# Patient Record
Sex: Male | Born: 1960 | Race: Black or African American | Hispanic: No | Smoking: Current every day smoker
Health system: Southern US, Community
[De-identification: ages and names within clinical notes are randomized; demographics above are authoritative.]

## PROBLEM LIST (undated history)

## (undated) ENCOUNTER — Emergency Department (HOSPITAL_BASED_OUTPATIENT_CLINIC_OR_DEPARTMENT_OTHER): Admission: EM | Payer: Medicare Other | Source: Home / Self Care

## (undated) DIAGNOSIS — G8929 Other chronic pain: Secondary | ICD-10-CM

## (undated) DIAGNOSIS — J449 Chronic obstructive pulmonary disease, unspecified: Secondary | ICD-10-CM

## (undated) DIAGNOSIS — IMO0002 Reserved for concepts with insufficient information to code with codable children: Secondary | ICD-10-CM

## (undated) HISTORY — PX: APPENDECTOMY: SHX54

## (undated) HISTORY — PX: KNEE SURGERY: SHX244

## (undated) HISTORY — PX: NECK SURGERY: SHX720

---

## 2011-11-19 ENCOUNTER — Emergency Department (HOSPITAL_BASED_OUTPATIENT_CLINIC_OR_DEPARTMENT_OTHER): Payer: Medicare Other

## 2011-11-19 ENCOUNTER — Emergency Department (HOSPITAL_BASED_OUTPATIENT_CLINIC_OR_DEPARTMENT_OTHER)
Admission: EM | Admit: 2011-11-19 | Discharge: 2011-11-19 | Disposition: A | Discharge status: Home / Self Care | Payer: Medicare Other | Attending: Emergency Medicine | Admitting: Emergency Medicine

## 2011-11-19 ENCOUNTER — Encounter (HOSPITAL_BASED_OUTPATIENT_CLINIC_OR_DEPARTMENT_OTHER): Payer: Self-pay | Admitting: *Deleted

## 2011-11-19 DIAGNOSIS — J4489 Other specified chronic obstructive pulmonary disease: Secondary | ICD-10-CM | POA: Insufficient documentation

## 2011-11-19 DIAGNOSIS — R918 Other nonspecific abnormal finding of lung field: Secondary | ICD-10-CM | POA: Insufficient documentation

## 2011-11-19 DIAGNOSIS — J449 Chronic obstructive pulmonary disease, unspecified: Secondary | ICD-10-CM | POA: Insufficient documentation

## 2011-11-19 DIAGNOSIS — K5900 Constipation, unspecified: Secondary | ICD-10-CM | POA: Insufficient documentation

## 2011-11-19 DIAGNOSIS — F17200 Nicotine dependence, unspecified, uncomplicated: Secondary | ICD-10-CM | POA: Insufficient documentation

## 2011-11-19 DIAGNOSIS — R109 Unspecified abdominal pain: Secondary | ICD-10-CM | POA: Insufficient documentation

## 2011-11-19 HISTORY — DX: Chronic obstructive pulmonary disease, unspecified: J44.9

## 2011-11-19 HISTORY — DX: Other chronic pain: G89.29

## 2011-11-19 LAB — BASIC METABOLIC PANEL
BUN: 10 mg/dL (ref 6–23)
Chloride: 102 mEq/L (ref 96–112)
GFR calc Af Amer: 79 mL/min — ABNORMAL LOW (ref >90)
Glucose, Bld: 107 mg/dL — ABNORMAL HIGH (ref 70–99)
Potassium: 3.8 mEq/L (ref 3.5–5.1)

## 2011-11-19 MED ORDER — ALBUTEROL SULFATE (5 MG/ML) 0.5% IN NEBU
2.5000 mg | INHALATION_SOLUTION | RESPIRATORY_TRACT | Status: DC
  Administered 2011-11-19: 2.5 mg via RESPIRATORY_TRACT
  Filled 2011-11-19: qty 0.5

## 2011-11-19 MED ORDER — IOHEXOL 300 MG/ML  SOLN
80.0000 mL | Freq: Once | INTRAMUSCULAR | Status: AC | PRN
  Administered 2011-11-19: 80 mL via INTRAVENOUS

## 2011-11-19 MED ORDER — IPRATROPIUM BROMIDE 0.02 % IN SOLN
0.5000 mg | RESPIRATORY_TRACT | Status: DC
  Administered 2011-11-19: 0.5 mg via RESPIRATORY_TRACT
  Filled 2011-11-19: qty 2.5

## 2011-11-19 NOTE — ED Notes (Signed)
Abdominal pressure since yesterday. Feels constipated. Took Murelax and stool softeners. States he takes a lot of narcotics for chronic pain.

## 2011-11-20 NOTE — ED Provider Notes (Signed)
History     CSN: [id number]  Arrival date & time 11/19/11  1908   First MD Initiated Contact with Patient 11/19/11 1918      Chief Complaint  Patient presents with  . Abdominal Pain    (Consider location/radiation/quality/duration/timing/severity/associated sxs/prior treatment) HPI Patient is a 51 yo male with history of COPD and frequent pain medication ingestion for spinal issues who presents complaining of diffuse abdominal discomfort and feeling of fullness.  Pain is described as moderate and achy.  He denies fevers, nausea, vomiting, sick contacts, diarrhea, or blood in his stools.  Patient reports constipation but last BM was today and normal in size.  These are occuring at least every other day.  PAtient says he has not been taking his miralax like he is supposed to.  He has no history of abdominal surgery. There are no other associated or modifying factors.  Past Medical History  Diagnosis Date  . Chronic pain   . COPD (chronic obstructive pulmonary disease)     Past Surgical History  Procedure Date  . Neck surgery   . Appendectomy     History reviewed. No pertinent family history.  History  Substance Use Topics  . Smoking status: Current Everyday Smoker -- 0.5 packs/day  . Smokeless tobacco: Not on file  . Alcohol Use: No      Review of Systems  Constitutional: Negative.   HENT: Negative.   Eyes: Negative.   Respiratory: Negative.   Cardiovascular: Negative.   Gastrointestinal: Positive for abdominal pain and constipation.  Genitourinary: Negative.   Musculoskeletal: Negative.   Skin: Negative.   Neurological: Negative.   Hematological: Negative.   Psychiatric/Behavioral: Negative.   All other systems reviewed and are negative.    Allergies  Review of patient's allergies indicates not on file.  Home Medications  No current outpatient prescriptions on file.  BP 140/70  Pulse 85  Temp 98.9 F (37.2 C) (Oral)  Resp 14  SpO2 96%  Physical  Exam  Nursing note and vitals reviewed. GEN: Well-developed, well-nourished male in no distress HEENT: Atraumatic, normocephalic. Oropharynx clear without erythema EYES: PERRLA BL, no scleral icterus. NECK: Trachea midline, no meningismus CV: regular rate and rhythm. No murmurs, rubs, or gallops PULM: No respiratory distress.  No crackles, wheezes, or rales.diminished throughout. More diminished on R compared to left. GI: soft, diffuse TTP. No guarding, rebound. + bowel sounds  GU: deferred Neuro: cranial nerves grossly 2-12 intact, no abnormalities of strength or sensation, A and O x 3 MSK: Patient moves all 4 extremities symmetrically, no deformity, edema, or injury noted Skin: No rashes petechiae, purpura, or jaundice Psych: anxious   ED Course  Procedures (including critical care time)  Labs Reviewed  BASIC METABOLIC PANEL - Abnormal; Notable for the following:    Glucose, Bld 107 (*)     GFR calc non Af Amer 68 (*)     GFR calc Af Amer 79 (*)     All other components within normal limits   Ct Angio Chest Pe W/cm &/or Wo Cm  11/19/2011  *RADIOLOGY REPORT*  Clinical Data: COPD, pain and right lung opacity by chest x-ray on abdominal series.  CT ANGIOGRAPHY CHEST  Technique:  Multidetector CT imaging of the chest using the standard protocol during bolus administration of intravenous contrast. Multiplanar reconstructed images including MIPs were obtained and reviewed to evaluate the vascular anatomy.  Contrast: 80mL OMNIPAQUE IOHEXOL 300 MG/ML SOLN  Comparison: Chest x-ray earlier today as part of an abdominal series.  Findings: Abnormality seen by chest x-ray represents an extended transversely oriented band like area of scarring and compressed lung in the right upper lobe that posteriorly abuts the major fissure.  The posterior aspect of this opacity is somewhat thicker and more focal in appearance measuring 11 mm.  Finding is felt to likely be benign as there is surrounding compressed  lung present due to a very large right upper bulla that occupies the entire apical portion of the right hemithorax and measures up to 13 cm in diameter.  No other abnormal parenchymal lesions are identified.  There is no evidence of pneumothorax.  No enlarged lymph nodes are seen.  The heart size is normal.  No evidence of pulmonary embolism.  The thoracic aorta is of normal caliber.  Some small probable benign cysts are identified in the left lobe of the liver.  No pleural effusions.  No bony abnormalities are identified.  IMPRESSION: Chest x-ray abnormality represents an area of compressed lung and scarring of the right upper lobe which is transversely oriented.  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   Original Report Authenticated By: Reola Calkins, M.D.    Dg Abd Acute W/chest  11/19/2011  *RADIOLOGY REPORT*  Clinical Data: Abdominal pain.  ACUTE ABDOMEN SERIES (ABDOMEN 2 VIEW & CHEST 1 VIEW)  Comparison: None.  Findings: Bowel gas pattern unremarkable without evidence of obstruction or significant ileus.  No evidence of free air or significant air fluid levels on the erect image.  Large stool burden.  No abnormal calcifications.  Regional skeleton intact.  Bullous emphysematous changes in the upper lobes, right greater than left.  Irregular opacity in the right mid lung.  Lungs otherwise clear.  Cardiomediastinal silhouette unremarkable.  IMPRESSION:  1.  No acute abdominal abnormality.  Large stool burden. 2.  Severe COPD/emphysema.  Irregular opacity in the right mid lung which may represent a lung mass.  Non-emergent CTA chest with contrast is recommended in further evaluation.   Original Report Authenticated By: Arnell Sieving, M.D.      1. Constipation   2. Abdominal pain       MDM  Patient was evaluated by myself.  Based on findings patient was evaluated with AAS to eval for possible obstruction and stool load.  Concern for possible RML mass  raised by CXR.  Given correlation with exam findings CT was ordered and showed this just to be scarring.  Patient was discharged in good condition with instructions to increase frequency of use of miralax and consider Fleet's enema at home if he continued to feel uncomfortable.  Patient can follow-up with his PCP.         Cyndra Numbers, MD 11/20/11 1028

## 2012-12-16 ENCOUNTER — Encounter (HOSPITAL_BASED_OUTPATIENT_CLINIC_OR_DEPARTMENT_OTHER): Payer: Self-pay | Admitting: Emergency Medicine

## 2012-12-16 ENCOUNTER — Emergency Department (HOSPITAL_BASED_OUTPATIENT_CLINIC_OR_DEPARTMENT_OTHER)
Admission: EM | Admit: 2012-12-16 | Discharge: 2012-12-16 | Disposition: A | Discharge status: Home / Self Care | Payer: Medicare Other | Attending: Emergency Medicine | Admitting: Emergency Medicine

## 2012-12-16 DIAGNOSIS — F17200 Nicotine dependence, unspecified, uncomplicated: Secondary | ICD-10-CM | POA: Insufficient documentation

## 2012-12-16 DIAGNOSIS — Z8739 Personal history of other diseases of the musculoskeletal system and connective tissue: Secondary | ICD-10-CM | POA: Insufficient documentation

## 2012-12-16 DIAGNOSIS — M545 Low back pain, unspecified: Secondary | ICD-10-CM | POA: Insufficient documentation

## 2012-12-16 DIAGNOSIS — J4489 Other specified chronic obstructive pulmonary disease: Secondary | ICD-10-CM | POA: Insufficient documentation

## 2012-12-16 DIAGNOSIS — G8929 Other chronic pain: Secondary | ICD-10-CM | POA: Insufficient documentation

## 2012-12-16 DIAGNOSIS — J449 Chronic obstructive pulmonary disease, unspecified: Secondary | ICD-10-CM | POA: Insufficient documentation

## 2012-12-16 HISTORY — DX: Reserved for concepts with insufficient information to code with codable children: IMO0002

## 2012-12-16 MED ORDER — HYDROCODONE-ACETAMINOPHEN 5-325 MG PO TABS: 2.0000 | ORAL_TABLET | Freq: Once | ORAL | Status: DC

## 2012-12-16 NOTE — ED Notes (Signed)
Pt c/o lower back pain radiating down right leg and neck pain. Pt has hx of degenerative disc

## 2012-12-16 NOTE — ED Notes (Signed)
Pt unable to take hydrocodone since he is driving and unable to get someone to pick him up. Pt offered IM injection of toradol. Pt refused stating he was afraid of needles. MD made aware. No new orders given.

## 2012-12-16 NOTE — ED Provider Notes (Signed)
CSN: [id number]     Arrival date & time 12/16/12  4098 History   First MD Initiated Contact with Patient 12/16/12 0544     Chief Complaint  Patient presents with  . Back Pain   (Consider location/radiation/quality/duration/timing/severity/associated sxs/prior Treatment) Patient is a 52 y.o. male presenting with back pain.  Back Pain Location:  Lumbar spine Quality:  Stabbing Radiates to:  R thigh Pain severity:  Severe Worse during: Worsened the morning. Onset quality:  Gradual Duration: Years. Timing:  Constant Progression:  Worsening Chronicity:  Chronic Context: emotional stress   Context: not recent injury   Context comment:  Ran out of pain medication last night Relieved by: Hydrocodone. Worsened by:  Bending, sitting and twisting Associated symptoms: no abdominal pain, no bladder incontinence, no bowel incontinence, no chest pain, no dysuria, no fever, no numbness, no paresthesias, no perianal numbness, no tingling and no weakness     Past Medical History  Diagnosis Date  . Chronic pain   . COPD (chronic obstructive pulmonary disease)   . Degenerative disc disease    Past Surgical History  Procedure Laterality Date  . Neck surgery    . Appendectomy     No family history on file. History  Substance Use Topics  . Smoking status: Current Every Day Smoker -- 0.50 packs/day  . Smokeless tobacco: Not on file  . Alcohol Use: No    Review of Systems  Constitutional: Negative for fever.  HENT: Negative for congestion.   Respiratory: Negative for cough and shortness of breath.   Cardiovascular: Negative for chest pain.  Gastrointestinal: Negative for nausea, vomiting, abdominal pain, diarrhea and bowel incontinence.  Genitourinary: Negative for bladder incontinence and dysuria.  Musculoskeletal: Positive for back pain.  Neurological: Negative for tingling, weakness, numbness and paresthesias.  All other systems reviewed and are negative.    Allergies  Review  of patient's allergies indicates no known allergies.  Home Medications   Current Outpatient Rx  Name  Route  Sig  Dispense  Refill  . ibuprofen (ADVIL,MOTRIN) 800 MG tablet   Oral   Take 400 mg by mouth 2 (two) times daily.         . traZODone (DESYREL) 50 MG tablet   Oral   Take 50 mg by mouth at bedtime.          BP 126/76  Pulse 97  Temp(Src) 98.2 F (36.8 C) (Oral)  Resp 18  SpO2 100% Physical Exam  Nursing note and vitals reviewed. Constitutional: He is oriented to person, place, and time. He appears well-developed and well-nourished. No distress.  HENT:  Head: Normocephalic and atraumatic.  Mouth/Throat: Oropharynx is clear and moist.  Eyes: Conjunctivae are normal. Pupils are equal, round, and reactive to light. No scleral icterus.  Neck: Neck supple.  Cardiovascular: Normal rate, regular rhythm, normal heart sounds and intact distal pulses.   No murmur heard. Pulmonary/Chest: Effort normal and breath sounds normal. No stridor. No respiratory distress. He has no wheezes. He has no rales.  Abdominal: Soft. He exhibits no distension. There is no tenderness.  Musculoskeletal: Normal range of motion. He exhibits no edema.  Neurological: He is alert and oriented to person, place, and time. He has normal strength. No sensory deficit. Gait normal.  Reflex Scores:      Patellar reflexes are 2+ on the right side and 2+ on the left side. Skin: Skin is warm and dry. No rash noted.  Psychiatric: He has a normal mood and affect. His behavior  is normal.    ED Course  Procedures (including critical care time) Labs Review Labs Reviewed - No data to display Imaging Review No results found.  MDM   1. Chronic back pain    52 year old male with history of chronic back pain presenting with worsening of his chronic back pain. He reports running out of his pain medications last night. No injuries. He denies bowel or bladder dysfunction, numbness or tingling, saddle  paresthesias, muscle weakness. He denies fevers, cancer, IV drug use. He does not have an indication for emergent imaging. He requests pain medications until he see his primary doctor. We'll provide dose of Norco in the ED. However, will not provide narcotic pain medication prescription for his chronic pain. Advised he contact his primary doctor. Return precautions given.    Candyce Churn, MD 12/16/12 [phone]

## 2012-12-16 NOTE — ED Notes (Signed)
Pt states he normally takes hydrocodone 10 mg with improvement in pain, but is out of these

## 2013-01-03 ENCOUNTER — Emergency Department (HOSPITAL_BASED_OUTPATIENT_CLINIC_OR_DEPARTMENT_OTHER)
Admission: EM | Admit: 2013-01-03 | Discharge: 2013-01-03 | Disposition: A | Discharge status: Home / Self Care | Payer: Medicare Other | Attending: Emergency Medicine | Admitting: Emergency Medicine

## 2013-01-03 ENCOUNTER — Encounter (HOSPITAL_BASED_OUTPATIENT_CLINIC_OR_DEPARTMENT_OTHER): Payer: Self-pay | Admitting: Emergency Medicine

## 2013-01-03 DIAGNOSIS — J4489 Other specified chronic obstructive pulmonary disease: Secondary | ICD-10-CM | POA: Insufficient documentation

## 2013-01-03 DIAGNOSIS — F17200 Nicotine dependence, unspecified, uncomplicated: Secondary | ICD-10-CM | POA: Insufficient documentation

## 2013-01-03 DIAGNOSIS — J449 Chronic obstructive pulmonary disease, unspecified: Secondary | ICD-10-CM | POA: Insufficient documentation

## 2013-01-03 DIAGNOSIS — IMO0002 Reserved for concepts with insufficient information to code with codable children: Secondary | ICD-10-CM | POA: Insufficient documentation

## 2013-01-03 DIAGNOSIS — Z791 Long term (current) use of non-steroidal anti-inflammatories (NSAID): Secondary | ICD-10-CM | POA: Insufficient documentation

## 2013-01-03 DIAGNOSIS — J029 Acute pharyngitis, unspecified: Secondary | ICD-10-CM

## 2013-01-03 DIAGNOSIS — G8929 Other chronic pain: Secondary | ICD-10-CM | POA: Insufficient documentation

## 2013-01-03 DIAGNOSIS — Z79899 Other long term (current) drug therapy: Secondary | ICD-10-CM | POA: Insufficient documentation

## 2013-01-03 DIAGNOSIS — Z9889 Other specified postprocedural states: Secondary | ICD-10-CM | POA: Insufficient documentation

## 2013-01-03 MED ORDER — DEXAMETHASONE 4 MG PO TABS: 10.0000 mg | ORAL_TABLET | Freq: Once | ORAL | Status: DC

## 2013-01-03 MED ORDER — AZITHROMYCIN 250 MG PO TABS: 250.0000 mg | ORAL_TABLET | Freq: Every day | ORAL | Status: DC

## 2013-01-03 MED ORDER — DEXAMETHASONE 1 MG/ML PO CONC
10.0000 mg | Freq: Once | ORAL | Status: AC
  Administered 2013-01-03: 10 mg via ORAL
  Filled 2013-01-03: qty 10

## 2013-01-03 MED ORDER — LIDOCAINE VISCOUS 2 % MT SOLN
15.0000 mL | Freq: Once | OROMUCOSAL | Status: AC
  Administered 2013-01-03: 15 mL via OROMUCOSAL
  Filled 2013-01-03: qty 15

## 2013-01-03 NOTE — ED Provider Notes (Signed)
CSN: [id number]     Arrival date & time 01/03/13  1048 History   First MD Initiated Contact with Patient 01/03/13 1120     Chief Complaint  Patient presents with  . Sore Throat   (Consider location/radiation/quality/duration/timing/severity/associated sxs/prior Treatment) HPI Patient presents with concern of sore throat, sinus drainage and congestion. Onset was 3 days ago.  Since onset symptoms been progressive. He denies fever, vomiting, chest pain, dyspnea.  There is minimal associated cough.  No relief with OTC medication.  Patient requests antibiotics. Patient states that he was in his usual state of health prior to the onset of pain.  Past Medical History  Diagnosis Date  . Chronic pain   . COPD (chronic obstructive pulmonary disease)   . Degenerative disc disease    Past Surgical History  Procedure Laterality Date  . Neck surgery    . Appendectomy     History reviewed. No pertinent family history. History  Substance Use Topics  . Smoking status: Current Every Day Smoker -- 0.50 packs/day    Types: Cigarettes  . Smokeless tobacco: Never Used  . Alcohol Use: No    Review of Systems  All other systems reviewed and are negative.    Allergies  Peanuts  Home Medications   Current Outpatient Rx  Name  Route  Sig  Dispense  Refill  . omeprazole (PRILOSEC) 20 MG capsule   Oral   Take 20 mg by mouth daily.         . Pseudoeph-Doxylamine-DM-APAP (NYQUIL PO)   Oral   Take by mouth.         . Pseudoeph-Doxylamine-DM-APAP (NYQUIL PO)   Oral   Take 10 mLs by mouth daily.         Marland Kitchen tiotropium (SPIRIVA) 18 MCG inhalation capsule   Inhalation   Place 18 mcg into inhaler and inhale daily.         . traZODone (DESYREL) 50 MG tablet   Oral   Take 50 mg by mouth at bedtime.         Marland Kitchen azithromycin (ZITHROMAX Z-PAK) 250 MG tablet   Oral   Take 1 tablet (250 mg total) by mouth daily. Take two tabs today (10/15 - then one each day for four days)   6  tablet   0   . ibuprofen (ADVIL,MOTRIN) 800 MG tablet   Oral   Take 400 mg by mouth 2 (two) times daily.          BP 126/87  Pulse 94  Temp(Src) 97.9 F (36.6 C) (Oral)  Resp 20  Ht 6\' 3"  (1.905 m)  Wt 212 lb (96.163 kg)  BMI 26.5 kg/m2  SpO2 100% Physical Exam  Nursing note and vitals reviewed. Constitutional: He is oriented to person, place, and time. He appears well-developed. No distress.  HENT:  Head: Normocephalic and atraumatic.  Mouth/Throat: Uvula is midline. Mucous membranes are not pale, not dry and not cyanotic. Posterior oropharyngeal erythema present. No oropharyngeal exudate, posterior oropharyngeal edema or tonsillar abscesses.  Eyes: Conjunctivae and EOM are normal.  Cardiovascular: Normal rate and regular rhythm.   Pulmonary/Chest: Effort normal. No stridor. No respiratory distress.  Abdominal: He exhibits no distension.  Musculoskeletal: He exhibits no edema.  Neurological: He is alert and oriented to person, place, and time.  Skin: Skin is warm and dry.  Psychiatric: He has a normal mood and affect.    ED Course  Procedures (including critical care time) Labs Review Labs Reviewed  RAPID STREP  SCREEN   Imaging Review No results found.  EKG Interpretation   None       MDM   1. Sore throat    Patient presents with concerns over sore throat, congestion, rhinorrhea.  No evidence of systemic illness, though the patient's progression of symptoms, the absence of response to OTC medication, there is some concern for ongoing viral versus bacterial pharyngitis/sinusitis.  No evidence of strep throat.  And patient requests antibiotics, and this was accommodated.  Absent distress, is appropriate for further evaluation as an outpatient, which was discussed with the patient at length.    Gerhard Munch, MD 01/03/13 1153

## 2013-01-03 NOTE — ED Notes (Signed)
MD at bedside. 

## 2013-01-03 NOTE — ED Notes (Signed)
Pt states that he has severely sore throat, postural drainage, sinus congestion.  Pt reports that onset was 3 days ago.  Denies fever, cough, body aches, chills, nausea, vomiting. Pt has been taking nyquil with no relief.

## 2013-01-05 LAB — CULTURE, GROUP A STREP

## 2013-04-18 ENCOUNTER — Emergency Department (HOSPITAL_BASED_OUTPATIENT_CLINIC_OR_DEPARTMENT_OTHER): Payer: Medicare Other

## 2013-04-18 ENCOUNTER — Encounter (HOSPITAL_BASED_OUTPATIENT_CLINIC_OR_DEPARTMENT_OTHER): Payer: Self-pay | Admitting: Emergency Medicine

## 2013-04-18 ENCOUNTER — Emergency Department (HOSPITAL_BASED_OUTPATIENT_CLINIC_OR_DEPARTMENT_OTHER)
Admission: EM | Admit: 2013-04-18 | Discharge: 2013-04-18 | Disposition: A | Discharge status: Home / Self Care | Payer: Medicare Other | Attending: Emergency Medicine | Admitting: Emergency Medicine

## 2013-04-18 DIAGNOSIS — F17200 Nicotine dependence, unspecified, uncomplicated: Secondary | ICD-10-CM | POA: Insufficient documentation

## 2013-04-18 DIAGNOSIS — Z9109 Other allergy status, other than to drugs and biological substances: Secondary | ICD-10-CM

## 2013-04-18 DIAGNOSIS — IMO0002 Reserved for concepts with insufficient information to code with codable children: Secondary | ICD-10-CM | POA: Insufficient documentation

## 2013-04-18 DIAGNOSIS — J309 Allergic rhinitis, unspecified: Secondary | ICD-10-CM | POA: Insufficient documentation

## 2013-04-18 DIAGNOSIS — Z792 Long term (current) use of antibiotics: Secondary | ICD-10-CM | POA: Insufficient documentation

## 2013-04-18 DIAGNOSIS — J449 Chronic obstructive pulmonary disease, unspecified: Secondary | ICD-10-CM | POA: Insufficient documentation

## 2013-04-18 DIAGNOSIS — Z79899 Other long term (current) drug therapy: Secondary | ICD-10-CM | POA: Insufficient documentation

## 2013-04-18 DIAGNOSIS — D1739 Benign lipomatous neoplasm of skin and subcutaneous tissue of other sites: Secondary | ICD-10-CM | POA: Insufficient documentation

## 2013-04-18 DIAGNOSIS — H538 Other visual disturbances: Secondary | ICD-10-CM | POA: Insufficient documentation

## 2013-04-18 DIAGNOSIS — Z791 Long term (current) use of non-steroidal anti-inflammatories (NSAID): Secondary | ICD-10-CM | POA: Insufficient documentation

## 2013-04-18 DIAGNOSIS — Z9889 Other specified postprocedural states: Secondary | ICD-10-CM | POA: Insufficient documentation

## 2013-04-18 DIAGNOSIS — D179 Benign lipomatous neoplasm, unspecified: Secondary | ICD-10-CM

## 2013-04-18 DIAGNOSIS — J4489 Other specified chronic obstructive pulmonary disease: Secondary | ICD-10-CM | POA: Insufficient documentation

## 2013-04-18 DIAGNOSIS — G8929 Other chronic pain: Secondary | ICD-10-CM | POA: Insufficient documentation

## 2013-04-18 LAB — GLUCOSE, CAPILLARY: Glucose-Capillary: 105 mg/dL — ABNORMAL HIGH (ref 70–99)

## 2013-04-18 MED ORDER — LORATADINE 10 MG PO TABS: 10.0000 mg | ORAL_TABLET | Freq: Every day | ORAL | Status: DC

## 2013-04-18 NOTE — Discharge Instructions (Signed)
Allergic Rhinitis Allergic rhinitis is when the mucous membranes in the nose respond to allergens. Allergens are particles in the air that cause your body to have an allergic reaction. This causes you to release allergic antibodies. Through a chain of events, these eventually cause you to release histamine into the blood stream. Although meant to protect the body, it is this release of histamine that causes your discomfort, such as frequent sneezing, congestion, and an itchy, runny nose.  CAUSES  Seasonal allergic rhinitis (hay fever) is caused by pollen allergens that may come from grasses, trees, and weeds. Year-round allergic rhinitis (perennial allergic rhinitis) is caused by allergens such as house dust mites, pet dander, and mold spores.  SYMPTOMS   Nasal stuffiness (congestion).  Itchy, runny nose with sneezing and tearing of the eyes. DIAGNOSIS  Your health care provider can help you determine the allergen or allergens that trigger your symptoms. If you and your health care provider are unable to determine the allergen, skin or blood testing may be used. TREATMENT  Allergic Rhinitis does not have a cure, but it can be controlled by:  Medicines and allergy shots (immunotherapy).  Avoiding the allergen. Hay fever may often be treated with antihistamines in pill or nasal spray forms. Antihistamines block the effects of histamine. There are over-the-counter medicines that may help with nasal congestion and swelling around the eyes. Check with your health care provider before taking or giving this medicine.  If avoiding the allergen or the medicine prescribed do not work, there are many new medicines your health care provider can prescribe. Stronger medicine may be used if initial measures are ineffective. Desensitizing injections can be used if medicine and avoidance does not work. Desensitization is when a patient is given ongoing shots until the body becomes less sensitive to the allergen.  Make sure you follow up with your health care provider if problems continue. HOME CARE INSTRUCTIONS It is not possible to completely avoid allergens, but you can reduce your symptoms by taking steps to limit your exposure to them. It helps to know exactly what you are allergic to so that you can avoid your specific triggers. SEEK MEDICAL CARE IF:   You have a fever.  You develop a cough that does not stop easily (persistent).  You have shortness of breath.  You start wheezing.  Symptoms interfere with normal daily activities. Document Released: 12/01/2000 Document Revised: 12/27/2012 Document Reviewed: 11/13/2012 [facility] Patient Information 2014 [place].  Lipoma A lipoma is a noncancerous (benign) tumor composed of fat cells. They are usually found under the skin (subcutaneous). A lipoma may occur in any tissue of the body that contains fat. Common areas for lipomas to appear include the back, shoulders, buttocks, and thighs. Lipomas are a very common soft tissue growth. They are soft and grow slowly. Most problems caused by a lipoma depend on where it is growing. DIAGNOSIS  A lipoma can be diagnosed with a physical exam. These tumors rarely become cancerous, but radiographic studies can help determine this for certain. Studies used may include:  Computerized X-ray scans (CT or CAT scan).  Computerized magnetic scans (MRI). TREATMENT  Small lipomas that are not causing problems may be watched. If a lipoma continues to enlarge or causes problems, removal is often the best treatment. Lipomas can also be removed to improve appearance. Surgery is done to remove the fatty cells and the surrounding capsule. Most often, this is done with medicine that numbs the area (local anesthetic). The removed tissue  is examined under a microscope to make sure it is not cancerous. Keep all follow-up appointments with your caregiver. SEEK MEDICAL CARE IF:   The lipoma becomes larger or hard.  The  lipoma becomes painful, red, or increasingly swollen. These could be signs of infection or a more serious condition. Document Released: 02/26/2002 Document Revised: 05/31/2011 Document Reviewed: 08/08/2009 [facility] Patient Information 2014 [place], [place].

## 2013-04-18 NOTE — ED Provider Notes (Signed)
CSN: [id number]     Arrival date & time 04/18/13  1730 History   First MD Initiated Contact with Patient 04/18/13 1746     Chief Complaint  Patient presents with  . Eye Problem   (Consider location/radiation/quality/duration/timing/severity/associated sxs/prior Treatment) HPI Comments: Patient is 53 year old male who presents to the ED with complaints of 3 months of blurred vision - he states that he lost his current eye glasses which he believes is causing this but he went to see the eye doctor today at the [facility] and they told him he might be diabetic and drew blood.  He is concerned about this.  He states that while he is here he is also concerned about chronic nasal congestion, runny nose for the past several months as well - he reports no sneezing, wheezing, reports clear rhinorrhea.  And while he is here he has noticed a "knot" to the left side of his knee (medial aspect) which has been there also for several months - he states this does not hurt he just wanted to know what it was.  He will be getting his new glasses from the [place] in 6 weeks.  He denies fever, chills, cough, chest congestion, chest pain, shortness of breath, swelling to any joint.  He also denies polyuria, polydipsia, and polyphagia.  Patient is a 53 y.o. male presenting with eye problem. The history is provided by the patient. No language interpreter was used.  Eye Problem Location:  Both Quality:  Unable to specify Severity:  Mild Onset quality:  Gradual Duration:  3 months Timing:  Constant Progression:  Worsening Chronicity:  Chronic Relieved by:  Nothing Worsened by:  Nothing tried Ineffective treatments:  None tried Associated symptoms: blurred vision   Associated symptoms: no crusting, no discharge, no double vision, no foreign body sensation, no headaches, no itching, no nausea, no photophobia, no redness, no swelling, no tearing, no vomiting and no weakness     Past Medical History  Diagnosis Date  . Chronic  pain   . COPD (chronic obstructive pulmonary disease)   . Degenerative disc disease    Past Surgical History  Procedure Laterality Date  . Neck surgery    . Appendectomy     History reviewed. No pertinent family history. History  Substance Use Topics  . Smoking status: Current Every Day Smoker -- 0.50 packs/day    Types: Cigarettes  . Smokeless tobacco: Never Used  . Alcohol Use: No    Review of Systems  Eyes: Positive for blurred vision. Negative for double vision, photophobia, discharge, redness and itching.  Gastrointestinal: Negative for nausea and vomiting.  Neurological: Negative for weakness and headaches.  All other systems reviewed and are negative.    Allergies  Peanuts  Home Medications   Current Outpatient Rx  Name  Route  Sig  Dispense  Refill  . azithromycin (ZITHROMAX Z-PAK) 250 MG tablet   Oral   Take 1 tablet (250 mg total) by mouth daily. Take two tabs today (10/15 - then one each day for four days)   6 tablet   0   . ibuprofen (ADVIL,MOTRIN) 800 MG tablet   Oral   Take 400 mg by mouth 2 (two) times daily.         Marland Kitchen omeprazole (PRILOSEC) 20 MG capsule   Oral   Take 20 mg by mouth daily.         . Pseudoeph-Doxylamine-DM-APAP (NYQUIL PO)   Oral   Take by mouth.         Marland Kitchen  Pseudoeph-Doxylamine-DM-APAP (NYQUIL PO)   Oral   Take 10 mLs by mouth daily.         Marland Kitchen tiotropium (SPIRIVA) 18 MCG inhalation capsule   Inhalation   Place 18 mcg into inhaler and inhale daily.         . traZODone (DESYREL) 50 MG tablet   Oral   Take 50 mg by mouth at bedtime.          BP 131/82  Pulse 80  Temp(Src) 98.4 F (36.9 C) (Oral)  Resp 16  Wt 212 lb (96.163 kg)  SpO2 100% Physical Exam  Nursing note and vitals reviewed. Constitutional: He is oriented to person, place, and time. He appears well-developed and well-nourished. No distress.  HENT:  Head: Normocephalic and atraumatic.  Right Ear: External ear normal.  Left Ear: External ear  normal.  Mouth/Throat: Oropharynx is clear and moist. No oropharyngeal exudate.  Boggy nasal mucosa with clear rhinorrhea, frontal and maxillary sinuses non tender to percussion.  Eyes: Conjunctivae and EOM are normal. Pupils are equal, round, and reactive to light. Right eye exhibits no chemosis, no discharge and no exudate. Left eye exhibits no chemosis, no discharge and no exudate. Right conjunctiva is not injected. Left conjunctiva is not injected. Left conjunctiva has no hemorrhage. No scleral icterus. Right eye exhibits normal extraocular motion and no nystagmus. Left eye exhibits normal extraocular motion and no nystagmus. Pupils are equal.  Fundoscopic exam:      The right eye shows no arteriolar narrowing, no AV nicking, no exudate and no hemorrhage. The right eye shows red reflex.       The left eye shows no arteriolar narrowing, no AV nicking, no exudate and no hemorrhage. The left eye shows red reflex.  Neck: Normal range of motion. Neck supple.  Cardiovascular: Normal rate, regular rhythm and normal heart sounds.  Exam reveals no gallop and no friction rub.   No murmur heard. Pulmonary/Chest: Effort normal and breath sounds normal. No respiratory distress. He has no wheezes. He has no rales. He exhibits no tenderness.  Abdominal: Soft. Bowel sounds are normal. He exhibits no distension. There is no tenderness. There is no rebound and no guarding.  Musculoskeletal: Normal range of motion. He exhibits no edema and no tenderness.  2cm lipoma noted to the medial left knee  Lymphadenopathy:    He has no cervical adenopathy.  Neurological: He is alert and oriented to person, place, and time. He exhibits normal muscle tone. Coordination normal.  Skin: Skin is warm and dry. No rash noted. No erythema. No pallor.  Psychiatric: He has a normal mood and affect. His behavior is normal. Judgment and thought content normal.    ED Course  Procedures (including critical care time) Labs  Review Labs Reviewed  GLUCOSE, CAPILLARY - Abnormal; Notable for the following:    Glucose-Capillary 105 (*)    All other components within normal limits   Imaging Review Dg Knee Complete 4 Views Left  04/18/2013   CLINICAL DATA:  Palpable knot over the medial aspect of the left knee  EXAM: LEFT KNEE - COMPLETE 4+ VIEW  COMPARISON:  None.  FINDINGS: Four views of the left knee reveal the bones to be adequately mineralized. There is beaking of the tibial spines. There are tiny osteophytes noted from the superior and inferior margins of the articular surface of the patella. The joint spaces are reasonably well-maintained for this nonweightbearing study. No joint effusion is demonstrated. No abnormal soft tissue mass is demonstrated  in the medial aspect of the left knee.  IMPRESSION: 1. No abnormality of the soft tissues of the medial aspect of the left knee is demonstrated. 2. There are mild degenerative changes present. 3. Follow-up MRI may be useful in further assessing the soft tissues of the knee.   Electronically Signed   By: David  Martinique   On: 04/18/2013 18:25    EKG Interpretation   None       MDM  Blurred vision Lipoma Allergies  Patient here after having been seen by his opthamologist at the [place].  His blood sugar here was well with post pradial normals at 105, so I doubt DM.  I suspect they have sent off an HgbA1c and he will get the results of this.  There is no indiciation of sinusitis at this time so I will treat with a nasal decongestant and have advised that he should see a surgeon for the lipoma.   Idalia Needle Joelyn Oms, [place] 04/18/13 1914

## 2013-04-18 NOTE — ED Notes (Signed)
Pt reports " blurred vision" [facility] " checked for Diabetes but labs not back yet" also c/o " lump to left knee

## 2013-04-18 NOTE — ED Provider Notes (Signed)
Medical screening examination/treatment/procedure(s) were performed by non-physician practitioner and as supervising physician I was immediately available for consultation/collaboration.  EKG Interpretation   None         Blanchie Dessert, MD 04/18/13 2203

## 2013-06-01 ENCOUNTER — Encounter (HOSPITAL_BASED_OUTPATIENT_CLINIC_OR_DEPARTMENT_OTHER): Payer: Self-pay | Admitting: Emergency Medicine

## 2013-06-01 ENCOUNTER — Emergency Department (HOSPITAL_BASED_OUTPATIENT_CLINIC_OR_DEPARTMENT_OTHER)
Admission: EM | Admit: 2013-06-01 | Discharge: 2013-06-01 | Disposition: A | Discharge status: Home / Self Care | Payer: Medicare Other | Attending: Emergency Medicine | Admitting: Emergency Medicine

## 2013-06-01 DIAGNOSIS — G8929 Other chronic pain: Secondary | ICD-10-CM | POA: Insufficient documentation

## 2013-06-01 DIAGNOSIS — K089 Disorder of teeth and supporting structures, unspecified: Secondary | ICD-10-CM

## 2013-06-01 DIAGNOSIS — K029 Dental caries, unspecified: Secondary | ICD-10-CM | POA: Insufficient documentation

## 2013-06-01 DIAGNOSIS — IMO0002 Reserved for concepts with insufficient information to code with codable children: Secondary | ICD-10-CM | POA: Insufficient documentation

## 2013-06-01 DIAGNOSIS — J3489 Other specified disorders of nose and nasal sinuses: Secondary | ICD-10-CM | POA: Insufficient documentation

## 2013-06-01 DIAGNOSIS — F17200 Nicotine dependence, unspecified, uncomplicated: Secondary | ICD-10-CM | POA: Insufficient documentation

## 2013-06-01 DIAGNOSIS — Z791 Long term (current) use of non-steroidal anti-inflammatories (NSAID): Secondary | ICD-10-CM | POA: Insufficient documentation

## 2013-06-01 DIAGNOSIS — J4489 Other specified chronic obstructive pulmonary disease: Secondary | ICD-10-CM | POA: Insufficient documentation

## 2013-06-01 DIAGNOSIS — Z792 Long term (current) use of antibiotics: Secondary | ICD-10-CM | POA: Insufficient documentation

## 2013-06-01 DIAGNOSIS — R0981 Nasal congestion: Secondary | ICD-10-CM

## 2013-06-01 DIAGNOSIS — Z79899 Other long term (current) drug therapy: Secondary | ICD-10-CM | POA: Insufficient documentation

## 2013-06-01 DIAGNOSIS — J449 Chronic obstructive pulmonary disease, unspecified: Secondary | ICD-10-CM | POA: Insufficient documentation

## 2013-06-01 NOTE — ED Notes (Signed)
States he thinks he has a "head cold" per pt. Ear pressure, cough with green sputum.

## 2013-06-01 NOTE — ED Provider Notes (Signed)
CSN: [id number]     Arrival date & time 06/01/13  1503 History   First MD Initiated Contact with Patient 06/01/13 1545     Chief Complaint  Patient presents with  . URI     (Consider location/radiation/quality/duration/timing/severity/associated sxs/prior Treatment) Patient is a 53 y.o. male presenting with URI.  URI  Pt reports 3 days of sinus congestion, associated with ear pressure with blowing nose, no fever, no sore throat or cough. Has tried Claritin without improvement. Also has poor dentition and requesting referral to dentist.   Past Medical History  Diagnosis Date  . Chronic pain   . COPD (chronic obstructive pulmonary disease)   . Degenerative disc disease    Past Surgical History  Procedure Laterality Date  . Neck surgery    . Appendectomy     No family history on file. History  Substance Use Topics  . Smoking status: Current Every Day Smoker -- 0.50 packs/day    Types: Cigarettes  . Smokeless tobacco: Never Used  . Alcohol Use: No    Review of Systems All other systems reviewed and are negative except as noted in HPI.     Allergies  Peanuts  Home Medications   Current Outpatient Rx  Name  Route  Sig  Dispense  Refill  . azithromycin (ZITHROMAX Z-PAK) 250 MG tablet   Oral   Take 1 tablet (250 mg total) by mouth daily. Take two tabs today (10/15 - then one each day for four days)   6 tablet   0   . ibuprofen (ADVIL,MOTRIN) 800 MG tablet   Oral   Take 400 mg by mouth 2 (two) times daily.         Marland Kitchen loratadine (CLARITIN) 10 MG tablet   Oral   Take 1 tablet (10 mg total) by mouth daily.   30 tablet   0   . omeprazole (PRILOSEC) 20 MG capsule   Oral   Take 20 mg by mouth daily.         . Pseudoeph-Doxylamine-DM-APAP (NYQUIL PO)   Oral   Take by mouth.         . Pseudoeph-Doxylamine-DM-APAP (NYQUIL PO)   Oral   Take 10 mLs by mouth daily.         Marland Kitchen tiotropium (SPIRIVA) 18 MCG inhalation capsule   Inhalation   Place 18 mcg into  inhaler and inhale daily.         . traZODone (DESYREL) 50 MG tablet   Oral   Take 50 mg by mouth at bedtime.          BP 139/92  Pulse 86  Temp(Src) 98.7 F (37.1 C) (Oral)  Resp 20  Ht 6\' 2"  (1.88 m)  Wt 212 lb (96.163 kg)  BMI 27.21 kg/m2  SpO2 99% Physical Exam  Nursing note and vitals reviewed. Constitutional: He is oriented to person, place, and time. He appears well-developed and well-nourished.  HENT:  Head: Normocephalic and atraumatic.  Nose: Mucosal edema and rhinorrhea present. Right sinus exhibits no maxillary sinus tenderness and no frontal sinus tenderness. Left sinus exhibits no maxillary sinus tenderness and no frontal sinus tenderness.  Poor dentition, particularly lower teeth which are decayed down to gumline  Eyes: EOM are normal. Pupils are equal, round, and reactive to light.  Neck: Normal range of motion. Neck supple.  Cardiovascular: Normal rate, normal heart sounds and intact distal pulses.   Pulmonary/Chest: Effort normal and breath sounds normal.  Abdominal: Bowel sounds are normal. He  exhibits no distension. There is no tenderness.  Musculoskeletal: Normal range of motion. He exhibits no edema and no tenderness.  Neurological: He is alert and oriented to person, place, and time. He has normal strength. No cranial nerve deficit or sensory deficit.  Skin: Skin is warm and dry. No rash noted.  Psychiatric: He has a normal mood and affect.    ED Course  Procedures (including critical care time) Labs Review Labs Reviewed - No data to display Imaging Review No results found.   EKG Interpretation None      MDM   Final diagnoses:  Nasal congestion  Poor dentition   Pt with viral URI symptoms. Advised symptomatic OTC meds. Referral to dentist for evaluation of dental problems.     Shanece Cochrane B. Karle Starch, MD 06/01/13 [id number]

## 2013-06-01 NOTE — Discharge Instructions (Signed)
Dental Care and Dentist Visits Dental care supports good overall health. Regular dental visits can also help you avoid dental pain, bleeding, infection, and other more serious health problems in the future. It is important to keep the mouth healthy because diseases in the teeth, gums, and other oral tissues can spread to other areas of the body. Some problems, such as diabetes, heart disease, and pre-term labor have been associated with poor oral health.  See your dentist every 6 months. If you experience emergency problems such as a toothache or broken tooth, go to the dentist right away. If you see your dentist regularly, you may catch problems early. It is easier to be treated for problems in the early stages.  WHAT TO EXPECT AT A DENTIST VISIT  Your dentist will look for many common oral health problems and recommend proper treatment. At your regular dental visit, you can expect:  Gentle cleaning of the teeth and gums. This includes scraping and polishing. This helps to remove the sticky substance around the teeth and gums (plaque). Plaque forms in the mouth shortly after eating. Over time, plaque hardens on the teeth as tartar. If tartar is not removed regularly, it can cause problems. Cleaning also helps remove stains.  Periodic X-rays. These pictures of the teeth and supporting bone will help your dentist assess the health of your teeth.  Periodic fluoride treatments. Fluoride is a natural mineral shown to help strengthen teeth. Fluoride treatmentinvolves applying a fluoride gel or varnish to the teeth. It is most commonly done in children.  Examination of the mouth, tongue, jaws, teeth, and gums to look for any oral health problems, such as:  Cavities (dental caries). This is decay on the tooth caused by plaque, sugar, and acid in the mouth. It is best to catch a cavity when it is small.  Inflammation of the gums caused by plaque buildup (gingivitis).  Problems with the mouth or malformed  or misaligned teeth.  Oral cancer or other diseases of the soft tissues or jaws. KEEP YOUR TEETH AND GUMS HEALTHY For healthy teeth and gums, follow these general guidelines as well as your dentist's specific advice:  Have your teeth professionally cleaned at the dentist every 6 months.  Brush twice daily with a fluoride toothpaste.  Floss your teeth daily.  Ask your dentist if you need fluoride supplements, treatments, or fluoride toothpaste.  Eat a healthy diet. Reduce foods and drinks with added sugar.  Avoid smoking. TREATMENT FOR ORAL HEALTH PROBLEMS If you have oral health problems, treatment varies depending on the conditions present in your teeth and gums.  Your caregiver will most likely recommend good oral hygiene at each visit.  For cavities, gingivitis, or other oral health disease, your caregiver will perform a procedure to treat the problem. This is typically done at a separate appointment. Sometimes your caregiver will refer you to another dental specialist for specific tooth problems or for surgery. SEEK IMMEDIATE DENTAL CARE IF:  You have pain, bleeding, or soreness in the gum, tooth, jaw, or mouth area.  A permanent tooth becomes loose or separated from the gum socket.  You experience a blow or injury to the mouth or jaw area. Document Released: 11/18/2010 Document Revised: 05/31/2011 Document Reviewed: 11/18/2010 [facility] Patient Information 2014 [place], [place].  Upper Respiratory Infection, Adult An upper respiratory infection (URI) is also sometimes known as the common cold. The upper respiratory tract includes the nose, sinuses, throat, trachea, and bronchi. Bronchi are the airways leading to the lungs.  Most people improve within 1 week, but symptoms can last up to 2 weeks. A residual cough may last even longer.  CAUSES Many different viruses can infect the tissues lining the upper respiratory tract. The tissues become irritated and inflamed and often  become very moist. Mucus production is also common. A cold is contagious. You can easily spread the virus to others by oral contact. This includes kissing, sharing a glass, coughing, or sneezing. Touching your mouth or nose and then touching a surface, which is then touched by another person, can also spread the virus. SYMPTOMS  Symptoms typically develop 1 to 3 days after you come in contact with a cold virus. Symptoms vary from person to person. They may include:  Runny nose.  Sneezing.  Nasal congestion.  Sinus irritation.  Sore throat.  Loss of voice (laryngitis).  Cough.  Fatigue.  Muscle aches.  Loss of appetite.  Headache.  Low-grade fever. DIAGNOSIS  You might diagnose your own cold based on familiar symptoms, since most people get a cold 2 to 3 times a year. Your caregiver can confirm this based on your exam. Most importantly, your caregiver can check that your symptoms are not due to another disease such as strep throat, sinusitis, pneumonia, asthma, or epiglottitis. Blood tests, throat tests, and X-rays are not necessary to diagnose a common cold, but they may sometimes be helpful in excluding other more serious diseases. Your caregiver will decide if any further tests are required. RISKS AND COMPLICATIONS  You may be at risk for a more severe case of the common cold if you smoke cigarettes, have chronic heart disease (such as heart failure) or lung disease (such as asthma), or if you have a weakened immune system. The very young and very old are also at risk for more serious infections. Bacterial sinusitis, middle ear infections, and bacterial pneumonia can complicate the common cold. The common cold can worsen asthma and chronic obstructive pulmonary disease (COPD). Sometimes, these complications can require emergency medical care and may be life-threatening. PREVENTION  The best way to protect against getting a cold is to practice good hygiene. Avoid oral or hand contact  with people with cold symptoms. Wash your hands often if contact occurs. There is no clear evidence that vitamin C, vitamin E, echinacea, or exercise reduces the chance of developing a cold. However, it is always recommended to get plenty of rest and practice good nutrition. TREATMENT  Treatment is directed at relieving symptoms. There is no cure. Antibiotics are not effective, because the infection is caused by a virus, not by bacteria. Treatment may include:  Increased fluid intake. Sports drinks offer valuable electrolytes, sugars, and fluids.  Breathing heated mist or steam (vaporizer or shower).  Eating chicken soup or other clear broths, and maintaining good nutrition.  Getting plenty of rest.  Using gargles or lozenges for comfort.  Controlling fevers with ibuprofen or acetaminophen as directed by your caregiver.  Increasing usage of your inhaler if you have asthma. Zinc gel and zinc lozenges, taken in the first 24 hours of the common cold, can shorten the duration and lessen the severity of symptoms. Pain medicines may help with fever, muscle aches, and throat pain. A variety of non-prescription medicines are available to treat congestion and runny nose. Your caregiver can make recommendations and may suggest nasal or lung inhalers for other symptoms.  HOME CARE INSTRUCTIONS   Only take over-the-counter or prescription medicines for pain, discomfort, or fever as directed by your caregiver.  Use a warm mist humidifier or inhale steam from a shower to increase air moisture. This may keep secretions moist and make it easier to breathe.  Drink enough water and fluids to keep your urine clear or pale yellow.  Rest as needed.  Return to work when your temperature has returned to normal or as your caregiver advises. You may need to stay home longer to avoid infecting others. You can also use a face mask and careful hand washing to prevent spread of the virus. SEEK MEDICAL CARE IF:    After the first few days, you feel you are getting worse rather than better.  You need your caregiver's advice about medicines to control symptoms.  You develop chills, worsening shortness of breath, or brown or red sputum. These may be signs of pneumonia.  You develop yellow or brown nasal discharge or pain in the face, especially when you bend forward. These may be signs of sinusitis.  You develop a fever, swollen neck glands, pain with swallowing, or white areas in the back of your throat. These may be signs of strep throat. SEEK IMMEDIATE MEDICAL CARE IF:   You have a fever.  You develop severe or persistent headache, ear pain, sinus pain, or chest pain.  You develop wheezing, a prolonged cough, cough up blood, or have a change in your usual mucus (if you have chronic lung disease).  You develop sore muscles or a stiff neck. Document Released: 09/01/2000 Document Revised: 05/31/2011 Document Reviewed: 07/10/2010 [facility] Patient Information 2014 [place], [place].

## 2013-06-01 NOTE — ED Notes (Signed)
No one was in room upon nurse accepting assignment and entering room.

## 2013-06-12 ENCOUNTER — Emergency Department (HOSPITAL_BASED_OUTPATIENT_CLINIC_OR_DEPARTMENT_OTHER)
Admission: EM | Admit: 2013-06-12 | Discharge: 2013-06-13 | Disposition: A | Discharge status: Home / Self Care | Payer: Medicare Other | Attending: Emergency Medicine | Admitting: Emergency Medicine

## 2013-06-12 ENCOUNTER — Emergency Department (HOSPITAL_BASED_OUTPATIENT_CLINIC_OR_DEPARTMENT_OTHER)
Admission: EM | Admit: 2013-06-12 | Discharge: 2013-06-12 | Disposition: A | Discharge status: Home / Self Care | Payer: Medicare Other | Attending: Emergency Medicine | Admitting: Emergency Medicine

## 2013-06-12 ENCOUNTER — Encounter (HOSPITAL_BASED_OUTPATIENT_CLINIC_OR_DEPARTMENT_OTHER): Payer: Self-pay | Admitting: Emergency Medicine

## 2013-06-12 DIAGNOSIS — Z792 Long term (current) use of antibiotics: Secondary | ICD-10-CM | POA: Insufficient documentation

## 2013-06-12 DIAGNOSIS — F17200 Nicotine dependence, unspecified, uncomplicated: Secondary | ICD-10-CM | POA: Insufficient documentation

## 2013-06-12 DIAGNOSIS — Z79899 Other long term (current) drug therapy: Secondary | ICD-10-CM | POA: Insufficient documentation

## 2013-06-12 DIAGNOSIS — J302 Other seasonal allergic rhinitis: Secondary | ICD-10-CM

## 2013-06-12 DIAGNOSIS — Z8739 Personal history of other diseases of the musculoskeletal system and connective tissue: Secondary | ICD-10-CM | POA: Insufficient documentation

## 2013-06-12 DIAGNOSIS — G8929 Other chronic pain: Secondary | ICD-10-CM | POA: Insufficient documentation

## 2013-06-12 DIAGNOSIS — L500 Allergic urticaria: Secondary | ICD-10-CM | POA: Insufficient documentation

## 2013-06-12 DIAGNOSIS — J449 Chronic obstructive pulmonary disease, unspecified: Secondary | ICD-10-CM | POA: Insufficient documentation

## 2013-06-12 DIAGNOSIS — J309 Allergic rhinitis, unspecified: Secondary | ICD-10-CM | POA: Insufficient documentation

## 2013-06-12 DIAGNOSIS — Z791 Long term (current) use of non-steroidal anti-inflammatories (NSAID): Secondary | ICD-10-CM | POA: Insufficient documentation

## 2013-06-12 DIAGNOSIS — J4489 Other specified chronic obstructive pulmonary disease: Secondary | ICD-10-CM | POA: Insufficient documentation

## 2013-06-12 DIAGNOSIS — T7840XA Allergy, unspecified, initial encounter: Secondary | ICD-10-CM

## 2013-06-12 MED ORDER — LORATADINE-PSEUDOEPHEDRINE ER 10-240 MG PO TB24: 1.0000 | ORAL_TABLET | Freq: Every day | ORAL | Status: AC

## 2013-06-12 NOTE — ED Notes (Signed)
Pt just seen here earlier for sinus congestion, given script for claritin d.  Immediately after with hives to bilateral arms.  No difficulty breathing.

## 2013-06-12 NOTE — Discharge Instructions (Signed)
Hay Fever Hay fever is an allergic reaction to particles in the air. It cannot be passed from person to person. It cannot be cured, but it can be controlled. CAUSES  Hay fever is caused by something that triggers an allergic reaction (allergens). The following are examples of allergens:  Ragweed.  Feathers.  Animal dander.  Grass and tree pollens.  Cigarette smoke.  House dust.  Pollution. SYMPTOMS   Sneezing.  Runny or stuffy nose.  Tearing eyes.  Itchy eyes, nose, mouth, throat, skin, or other area.  Sore throat.  Headache.  Decreased sense of smell or taste. DIAGNOSIS Your caregiver will perform a physical exam and ask questions about the symptoms you are having.Allergy testing may be done to determine exactly what triggers your hay fever.  TREATMENT   Over-the-counter medicines may help symptoms. These include:  Antihistamines.  Decongestants. These may help with nasal congestion.  Your caregiver may prescribe medicines if over-the-counter medicines do not work.  Some people benefit from allergy shots when other medicines are not helpful. HOME CARE INSTRUCTIONS   Avoid the allergen that is causing your symptoms, if possible.  Take all medicine as told by your caregiver. SEEK MEDICAL CARE IF:   You have severe allergy symptoms and your current medicines are not helping.  Your treatment was working at one time, but you are now experiencing symptoms.  You have sinus congestion and pressure.  You develop a fever or headache.  You have thick nasal discharge.  You have asthma and have a worsening cough and wheezing. SEEK IMMEDIATE MEDICAL CARE IF:   You have swelling of your tongue or lips.  You have trouble breathing.  You feel lightheaded or like you are going to faint.  You have cold sweats.  You have a fever. Document Released: 03/08/2005 Document Revised: 05/31/2011 Document Reviewed: 06/03/2010 [employer] Patient Information 2014  [employer]  

## 2013-06-12 NOTE — ED Provider Notes (Signed)
CSN: [id number]     Arrival date & time 06/12/13  1731 History   First MD Initiated Contact with Patient 06/12/13 1739     Chief Complaint  Patient presents with  . Facial Pain     (Consider location/radiation/quality/duration/timing/severity/associated sxs/prior Treatment) HPI Pt reports several weeks of nasal congestion seen for same by me earlier this month given advise for OTC decongestants which helped for a while, but symptoms returned several days ago with itching, watery eyes and puffiness under eyes, continued nasal congestion and post-nasal drip but no fever, minimal facial pain.  Past Medical History  Diagnosis Date  . Chronic pain   . COPD (chronic obstructive pulmonary disease)   . Degenerative disc disease    Past Surgical History  Procedure Laterality Date  . Neck surgery    . Appendectomy     No family history on file. History  Substance Use Topics  . Smoking status: Light Tobacco Smoker    Types: Cigarettes  . Smokeless tobacco: Never Used  . Alcohol Use: No    Review of Systems All other systems reviewed and are negative except as noted in HPI.     Allergies  Peanuts  Home Medications   Current Outpatient Rx  Name  Route  Sig  Dispense  Refill  . azithromycin (ZITHROMAX Z-PAK) 250 MG tablet   Oral   Take 1 tablet (250 mg total) by mouth daily. Take two tabs today (10/15 - then one each day for four days)   6 tablet   0   . ibuprofen (ADVIL,MOTRIN) 800 MG tablet   Oral   Take 400 mg by mouth 2 (two) times daily.         Marland Kitchen loratadine (CLARITIN) 10 MG tablet   Oral   Take 1 tablet (10 mg total) by mouth daily.   30 tablet   0   . omeprazole (PRILOSEC) 20 MG capsule   Oral   Take 20 mg by mouth daily.         . Pseudoeph-Doxylamine-DM-APAP (NYQUIL PO)   Oral   Take by mouth.         . Pseudoeph-Doxylamine-DM-APAP (NYQUIL PO)   Oral   Take 10 mLs by mouth daily.         Marland Kitchen tiotropium (SPIRIVA) 18 MCG inhalation capsule  Inhalation   Place 18 mcg into inhaler and inhale daily.         . traZODone (DESYREL) 50 MG tablet   Oral   Take 50 mg by mouth at bedtime.          BP 148/84  Pulse 96  Temp(Src) 98.8 F (37.1 C) (Oral)  Resp 18  Ht 6\' 2"  (1.88 m)  Wt 216 lb (97.977 kg)  BMI 27.72 kg/m2  SpO2 97% Physical Exam  Nursing note and vitals reviewed. Constitutional: He is oriented to person, place, and time. He appears well-developed and well-nourished.  HENT:  Head: Normocephalic and atraumatic.  Nose: Mucosal edema present.  Soft tissue puffiness under eyes  Eyes: EOM are normal. Pupils are equal, round, and reactive to light.  Neck: Normal range of motion. Neck supple.  Cardiovascular: Normal rate, normal heart sounds and intact distal pulses.   Pulmonary/Chest: Effort normal and breath sounds normal.  Abdominal: Bowel sounds are normal. He exhibits no distension. There is no tenderness.  Musculoskeletal: Normal range of motion. He exhibits no edema and no tenderness.  Neurological: He is alert and oriented to person, place, and time. He  has normal strength. No cranial nerve deficit or sensory deficit.  Skin: Skin is warm and dry. No rash noted.  Psychiatric: He has a normal mood and affect.    ED Course  Procedures (including critical care time) Labs Review Labs Reviewed - No data to display Imaging Review No results found.   EKG Interpretation None      MDM   Final diagnoses:  Seasonal allergies    Symptoms today continue to be mild. No clinical signs of sinusitis. Likely seasonal allergies at this point given increased pollen in the environment the last week or so. Advised Claritin-D. PCP followup.     Gregory Norris Starch, MD 06/12/13 [phone]

## 2013-06-12 NOTE — ED Notes (Signed)
Pt reports sinus ha and pressure, congestion.  Was seen here for same recently, had relief following meds but now symptoms are recurrent.

## 2013-06-13 ENCOUNTER — Encounter (HOSPITAL_BASED_OUTPATIENT_CLINIC_OR_DEPARTMENT_OTHER): Payer: Self-pay | Admitting: Emergency Medicine

## 2013-06-13 MED ORDER — FAMOTIDINE 20 MG PO TABS: 20.0000 mg | ORAL_TABLET | Freq: Two times a day (BID) | ORAL | Status: AC

## 2013-06-13 MED ORDER — DIPHENHYDRAMINE HCL 25 MG PO CAPS
25.0000 mg | ORAL_CAPSULE | Freq: Once | ORAL | Status: AC
  Administered 2013-06-13: 25 mg via ORAL
  Filled 2013-06-13: qty 1

## 2013-06-13 MED ORDER — FAMOTIDINE 20 MG PO TABS
20.0000 mg | ORAL_TABLET | Freq: Once | ORAL | Status: AC
  Administered 2013-06-13: 20 mg via ORAL
  Filled 2013-06-13: qty 1

## 2013-06-13 NOTE — ED Provider Notes (Signed)
CSN: [id number]     Arrival date & time 06/12/13  2315 History   First MD Initiated Contact with Patient 06/12/13 2359     Chief Complaint  Patient presents with  . Urticaria     (Consider location/radiation/quality/duration/timing/severity/associated sxs/prior Treatment) Patient is a 53 y.o. male presenting with urticaria. The history is provided by the patient. No language interpreter was used.  Urticaria This is a new problem. The current episode started 1 to 2 hours ago. The problem occurs constantly. The problem has been rapidly improving. Pertinent negatives include no chest pain, no abdominal pain, no headaches and no shortness of breath. Nothing aggravates the symptoms. Nothing relieves the symptoms. He has tried nothing for the symptoms. The treatment provided no relief.    Past Medical History  Diagnosis Date  . Chronic pain   . COPD (chronic obstructive pulmonary disease)   . Degenerative disc disease    Past Surgical History  Procedure Laterality Date  . Neck surgery    . Appendectomy     No family history on file. History  Substance Use Topics  . Smoking status: Light Tobacco Smoker    Types: Cigarettes  . Smokeless tobacco: Never Used  . Alcohol Use: No    Review of Systems  HENT: Negative for drooling.   Respiratory: Negative for shortness of breath.   Cardiovascular: Negative for chest pain.  Gastrointestinal: Negative for abdominal pain.  Skin: Positive for rash.  Neurological: Negative for headaches.  All other systems reviewed and are negative.      Allergies  Peanuts  Home Medications   Current Outpatient Rx  Name  Route  Sig  Dispense  Refill  . ibuprofen (ADVIL,MOTRIN) 800 MG tablet   Oral   Take 400 mg by mouth 2 (two) times daily.         Marland Kitchen loratadine-pseudoephedrine (CLARITIN-D 24 HOUR) 10-240 MG per 24 hr tablet   Oral   Take 1 tablet by mouth daily.   14 tablet   0   . omeprazole (PRILOSEC) 20 MG capsule   Oral   Take 20  mg by mouth daily.         Marland Kitchen tiotropium (SPIRIVA) 18 MCG inhalation capsule   Inhalation   Place 18 mcg into inhaler and inhale daily.         . traZODone (DESYREL) 50 MG tablet   Oral   Take 50 mg by mouth at bedtime.          BP 141/88  Pulse 73  Temp(Src) 98.4 F (36.9 C)  Resp 18  SpO2 99% Physical Exam  Constitutional: He is oriented to person, place, and time. He appears well-developed and well-nourished. No distress.  HENT:  Head: Normocephalic and atraumatic.  Mouth/Throat: Oropharynx is clear and moist. No oropharyngeal exudate.  No swelling of the lips tongue or uvula  Eyes: Conjunctivae and EOM are normal. Pupils are equal, round, and reactive to light.  Neck: Normal range of motion. Neck supple.  Cardiovascular: Normal rate, regular rhythm and intact distal pulses.   Pulmonary/Chest: Effort normal and breath sounds normal. No stridor. No respiratory distress. He has no wheezes. He has no rales.  Abdominal: Soft. Bowel sounds are normal. There is no tenderness. There is no rebound and no guarding.  Musculoskeletal: Normal range of motion.  Neurological: He is alert and oriented to person, place, and time.  Skin: Skin is warm and dry. No rash noted.  Psychiatric: He has a normal mood and affect.  ED Course  Procedures (including critical care time) Labs Review Labs Reviewed - No data to display Imaging Review No results found.   EKG Interpretation None      MDM   Final diagnoses:  None    Urticaria resolved.  It is not the claritin as patient already had symptoms.  I suspect it was the mushroom soup the patient ate earlier.  Avoid same, switch from claritin to benadryl free.  Will add pepcid    Jailan Trimm K Alexzandrea Normington-Rasch, MD 06/13/13 [phone]

## 2013-06-13 NOTE — ED Notes (Signed)
MD at bedside. 

## 2013-06-13 NOTE — Discharge Instructions (Signed)
Allergies °Allergies may happen from anything your body is sensitive to. This may be food, medicines, pollens, chemicals, and nearly anything around you in everyday life that produces allergens. An allergen is anything that causes an allergy producing substance. Heredity is often a factor in causing these problems. This means you may have some of the same allergies as your parents. °Food allergies happen in all age groups. Food allergies are some of the most severe and life threatening. Some common food allergies are cow's milk, seafood, eggs, nuts, wheat, and soybeans. °SYMPTOMS  °· Swelling around the mouth. °· An itchy red rash or hives. °· Vomiting or diarrhea. °· Difficulty breathing. °SEVERE ALLERGIC REACTIONS ARE LIFE-THREATENING. °This reaction is called anaphylaxis. It can cause the mouth and throat to swell and cause difficulty with breathing and swallowing. In severe reactions only a trace amount of food (for example, peanut oil in a salad) may cause death within seconds. °Seasonal allergies occur in all age groups. These are seasonal because they usually occur during the same season every year. They may be a reaction to molds, grass pollens, or tree pollens. Other causes of problems are house dust mite allergens, pet dander, and mold spores. The symptoms often consist of nasal congestion, a runny itchy nose associated with sneezing, and tearing itchy eyes. There is often an associated itching of the mouth and ears. The problems happen when you come in contact with pollens and other allergens. Allergens are the particles in the air that the body reacts to with an allergic reaction. This causes you to release allergic antibodies. Through a chain of events, these eventually cause you to release histamine into the blood stream. Although it is meant to be protective to the body, it is this release that causes your discomfort. This is why you were given anti-histamines to feel better.  If you are unable to  pinpoint the offending allergen, it may be determined by skin or blood testing. Allergies cannot be cured but can be controlled with medicine. °Hay fever is a collection of all or some of the seasonal allergy problems. It may often be treated with simple over-the-counter medicine such as diphenhydramine. Take medicine as directed. Do not drink alcohol or drive while taking this medicine. Check with your caregiver or package insert for child dosages. °If these medicines are not effective, there are many new medicines your caregiver can prescribe. Stronger medicine such as nasal spray, eye drops, and corticosteroids may be used if the first things you try do not work well. Other treatments such as immunotherapy or desensitizing injections can be used if all else fails. Follow up with your caregiver if problems continue. These seasonal allergies are usually not life threatening. They are generally more of a nuisance that can often be handled using medicine. °HOME CARE INSTRUCTIONS  °· If unsure what causes a reaction, keep a diary of foods eaten and symptoms that follow. Avoid foods that cause reactions. °· If hives or rash are present: °· Take medicine as directed. °· You may use an over-the-counter antihistamine (diphenhydramine) for hives and itching as needed. °· Apply cold compresses (cloths) to the skin or take baths in cool water. Avoid hot baths or showers. Heat will make a rash and itching worse. °· If you are severely allergic: °· Following a treatment for a severe reaction, hospitalization is often required for closer follow-up. °· Wear a medic-alert bracelet or necklace stating the allergy. °· You and your family must learn how to give adrenaline or use   an anaphylaxis kit. °· If you have had a severe reaction, always carry your anaphylaxis kit or EpiPen® with you. Use this medicine as directed by your caregiver if a severe reaction is occurring. Failure to do so could have a fatal outcome. °SEEK MEDICAL  CARE IF: °· You suspect a food allergy. Symptoms generally happen within 30 minutes of eating a food. °· Your symptoms have not gone away within 2 days or are getting worse. °· You develop new symptoms. °· You want to retest yourself or your child with a food or drink you think causes an allergic reaction. Never do this if an anaphylactic reaction to that food or drink has happened before. Only do this under the care of a caregiver. °SEEK IMMEDIATE MEDICAL CARE IF:  °· You have difficulty breathing, are wheezing, or have a tight feeling in your chest or throat. °· You have a swollen mouth, or you have hives, swelling, or itching all over your body. °· You have had a severe reaction that has responded to your anaphylaxis kit or an EpiPen®. These reactions may return when the medicine has worn off. These reactions should be considered life threatening. °MAKE SURE YOU:  °· Understand these instructions. °· Will watch your condition. °· Will get help right away if you are not doing well or get worse. °Document Released: 06/01/2002 Document Revised: 07/03/2012 Document Reviewed: 11/06/2007 °[employer]® Patient Information ©2014 [employer]. ° °

## 2013-07-25 ENCOUNTER — Emergency Department (HOSPITAL_BASED_OUTPATIENT_CLINIC_OR_DEPARTMENT_OTHER)
Admission: EM | Admit: 2013-07-25 | Discharge: 2013-07-25 | Disposition: A | Discharge status: Home / Self Care | Payer: Medicare Other | Attending: Emergency Medicine | Admitting: Emergency Medicine

## 2013-07-25 ENCOUNTER — Encounter (HOSPITAL_BASED_OUTPATIENT_CLINIC_OR_DEPARTMENT_OTHER): Payer: Self-pay | Admitting: Emergency Medicine

## 2013-07-25 DIAGNOSIS — J449 Chronic obstructive pulmonary disease, unspecified: Secondary | ICD-10-CM | POA: Insufficient documentation

## 2013-07-25 DIAGNOSIS — J4489 Other specified chronic obstructive pulmonary disease: Secondary | ICD-10-CM | POA: Insufficient documentation

## 2013-07-25 DIAGNOSIS — Z87891 Personal history of nicotine dependence: Secondary | ICD-10-CM | POA: Insufficient documentation

## 2013-07-25 DIAGNOSIS — R112 Nausea with vomiting, unspecified: Secondary | ICD-10-CM

## 2013-07-25 DIAGNOSIS — Z79899 Other long term (current) drug therapy: Secondary | ICD-10-CM | POA: Insufficient documentation

## 2013-07-25 DIAGNOSIS — IMO0002 Reserved for concepts with insufficient information to code with codable children: Secondary | ICD-10-CM | POA: Insufficient documentation

## 2013-07-25 DIAGNOSIS — R509 Fever, unspecified: Secondary | ICD-10-CM | POA: Insufficient documentation

## 2013-07-25 DIAGNOSIS — IMO0001 Reserved for inherently not codable concepts without codable children: Secondary | ICD-10-CM | POA: Insufficient documentation

## 2013-07-25 DIAGNOSIS — G8929 Other chronic pain: Secondary | ICD-10-CM | POA: Insufficient documentation

## 2013-07-25 DIAGNOSIS — Z791 Long term (current) use of non-steroidal anti-inflammatories (NSAID): Secondary | ICD-10-CM | POA: Insufficient documentation

## 2013-07-25 LAB — COMPREHENSIVE METABOLIC PANEL
ALBUMIN: 4.1 g/dL (ref 3.5–5.2)
ALT: 26 U/L (ref 0–53)
AST: 25 U/L (ref 0–37)
Alkaline Phosphatase: 57 U/L (ref 39–117)
BUN: 9 mg/dL (ref 6–23)
CALCIUM: 9.7 mg/dL (ref 8.4–10.5)
CO2: 26 mEq/L (ref 19–32)
Chloride: 101 mEq/L (ref 96–112)
Creatinine, Ser: 1.1 mg/dL (ref 0.50–1.35)
GFR calc non Af Amer: 74 mL/min — ABNORMAL LOW (ref >90)
GFR, EST AFRICAN AMERICAN: 86 mL/min — AB (ref >90)
GLUCOSE: 112 mg/dL — AB (ref 70–99)
Potassium: 4 mEq/L (ref 3.7–5.3)
SODIUM: 140 meq/L (ref 137–147)
Total Bilirubin: 0.4 mg/dL (ref 0.3–1.2)
Total Protein: 7.4 g/dL (ref 6.0–8.3)

## 2013-07-25 LAB — CBC WITH DIFFERENTIAL/PLATELET
Basophils Absolute: 0 10*3/uL (ref 0.0–0.1)
Basophils Relative: 0 % (ref 0–1)
EOS ABS: 0 10*3/uL (ref 0.0–0.7)
Eosinophils Relative: 1 % (ref 0–5)
HCT: 40.8 % (ref 39.0–52.0)
Hemoglobin: 13.9 g/dL (ref 13.0–17.0)
LYMPHS ABS: 1 10*3/uL (ref 0.7–4.0)
Lymphocytes Relative: 17 % (ref 12–46)
MCH: 27.3 pg (ref 26.0–34.0)
MCHC: 34.1 g/dL (ref 30.0–36.0)
MCV: 80 fL (ref 78.0–100.0)
Monocytes Absolute: 0.4 10*3/uL (ref 0.1–1.0)
Monocytes Relative: 7 % (ref 3–12)
NEUTROS PCT: 75 % (ref 43–77)
Neutro Abs: 4.3 10*3/uL (ref 1.7–7.7)
PLATELETS: 237 10*3/uL (ref 150–400)
RBC: 5.1 MIL/uL (ref 4.22–5.81)
RDW: 14.2 % (ref 11.5–15.5)
WBC: 5.8 10*3/uL (ref 4.0–10.5)

## 2013-07-25 LAB — URINALYSIS, ROUTINE W REFLEX MICROSCOPIC
Bilirubin Urine: NEGATIVE
Glucose, UA: NEGATIVE mg/dL
Hgb urine dipstick: NEGATIVE
Ketones, ur: NEGATIVE mg/dL
Leukocytes, UA: NEGATIVE
NITRITE: NEGATIVE
PH: 8 (ref 5.0–8.0)
Protein, ur: NEGATIVE mg/dL
SPECIFIC GRAVITY, URINE: 1.013 (ref 1.005–1.030)
UROBILINOGEN UA: 1 mg/dL (ref 0.0–1.0)

## 2013-07-25 LAB — LIPASE, BLOOD: LIPASE: 13 U/L (ref 11–59)

## 2013-07-25 MED ORDER — ONDANSETRON HCL 4 MG PO TABS: 4.0000 mg | ORAL_TABLET | Freq: Four times a day (QID) | ORAL | Status: AC

## 2013-07-25 MED ORDER — SODIUM CHLORIDE 0.9 % IV SOLN
1000.0000 mL | Freq: Once | INTRAVENOUS | Status: AC
  Administered 2013-07-25: 1000 mL via INTRAVENOUS

## 2013-07-25 MED ORDER — SODIUM CHLORIDE 0.9 % IV SOLN
1000.0000 mL | INTRAVENOUS | Status: DC
  Administered 2013-07-25: 1000 mL via INTRAVENOUS

## 2013-07-25 MED ORDER — ONDANSETRON HCL 4 MG/2ML IJ SOLN
4.0000 mg | Freq: Once | INTRAMUSCULAR | Status: AC
  Administered 2013-07-25: 4 mg via INTRAVENOUS
  Filled 2013-07-25: qty 2

## 2013-07-25 NOTE — ED Provider Notes (Signed)
CSN: [id number]     Arrival date & time 07/25/13  1723 History   First MD Initiated Contact with Patient 07/25/13 1737     Chief Complaint  Patient presents with  . Emesis     HPI Comments: Pt ate some chinese food last night.  This morning he ate a sausage and cheese biscuit.  He vomited 4 times today and then developed a fever so he decided to get checked out.  Patient is a 53 y.o. male presenting with vomiting. The history is provided by the patient.  Emesis Severity:  Moderate Duration:  1 day Timing:  Intermittent Quality:  Stomach contents Progression:  Improving Chronicity:  New Recent urination:  Normal Relieved by:  Nothing Worsened by:  Nothing tried Associated symptoms: fever and myalgias   Associated symptoms: no abdominal pain, no cough and no diarrhea   Risk factors: no travel to endemic areas   No known ill contacts.    Past Medical History  Diagnosis Date  . Chronic pain   . COPD (chronic obstructive pulmonary disease)   . Degenerative disc disease    Past Surgical History  Procedure Laterality Date  . Neck surgery    . Appendectomy    . Knee surgery     No family history on file. History  Substance Use Topics  . Smoking status: Former Smoker    Types: Cigarettes  . Smokeless tobacco: Never Used  . Alcohol Use: No    Review of Systems  Gastrointestinal: Positive for vomiting. Negative for abdominal pain and diarrhea.  Musculoskeletal: Positive for myalgias.  All other systems reviewed and are negative.     Allergies  Peanuts  Home Medications   Prior to Admission medications   Medication Sig Start Date End Date Taking? Authorizing Provider  oxycodone (ROXICODONE) 30 MG immediate release tablet Take 30 mg by mouth 2 (two) times daily.   Yes Historical Provider, MD  famotidine (PEPCID) 20 MG tablet Take 1 tablet (20 mg total) by mouth 2 (two) times daily. 06/13/13   April K Palumbo-Rasch, MD  ibuprofen (ADVIL,MOTRIN) 800 MG tablet Take 400  mg by mouth 2 (two) times daily.    Historical Provider, MD  loratadine-pseudoephedrine (CLARITIN-D 24 HOUR) 10-240 MG per 24 hr tablet Take 1 tablet by mouth daily. 06/12/13   Charles B. Karle Starch, MD  omeprazole (PRILOSEC) 20 MG capsule Take 20 mg by mouth daily.    Historical Provider, MD  tiotropium (SPIRIVA) 18 MCG inhalation capsule Place 18 mcg into inhaler and inhale daily.    Historical Provider, MD  traZODone (DESYREL) 50 MG tablet Take 50 mg by mouth at bedtime.    Historical Provider, MD   BP 118/97  Pulse 108  Temp(Src) 100.1 F (37.8 C) (Oral)  Resp 16  Ht 6\' 2"  (1.88 m)  Wt 212 lb (96.163 kg)  BMI 27.21 kg/m2  SpO2 96% Physical Exam  Nursing note and vitals reviewed. Constitutional: He appears well-developed and well-nourished. No distress.  HENT:  Head: Normocephalic and atraumatic.  Right Ear: External ear normal.  Left Ear: External ear normal.  Eyes: Conjunctivae are normal. Right eye exhibits no discharge. Left eye exhibits no discharge. No scleral icterus.  Neck: Neck supple. No tracheal deviation present.  Cardiovascular: Normal rate, regular rhythm and intact distal pulses.   Pulmonary/Chest: Effort normal and breath sounds normal. No stridor. No respiratory distress. He has no wheezes. He has no rales.  Abdominal: Soft. Bowel sounds are normal. He exhibits no distension. There  is no tenderness. There is no rebound and no guarding.  Musculoskeletal: He exhibits no edema and no tenderness.  Neurological: He is alert. He has normal strength. No cranial nerve deficit (no facial droop, extraocular movements intact, no slurred speech) or sensory deficit. He exhibits normal muscle tone. He displays no seizure activity. Coordination normal.  Skin: Skin is warm and dry. No rash noted.  Psychiatric: He has a normal mood and affect.    ED Course  Procedures (including critical care time) Labs Review Labs Reviewed  COMPREHENSIVE METABOLIC PANEL - Abnormal; Notable for  the following:    Glucose, Bld 112 (*)    GFR calc non Af Amer 74 (*)    GFR calc Af Amer 86 (*)    All other components within normal limits  CBC WITH DIFFERENTIAL  LIPASE, BLOOD  URINALYSIS, ROUTINE W REFLEX MICROSCOPIC     MDM   Final diagnoses:  Nausea and vomiting    Pt's exam and labs are reassuring.   Symptoms have been improving throughout the day.  Pt now feels well and wants to leave.  He is hungry and wants to get something to eat.  ?viral etiology, ?food poisoning  At this time there does not appear to be any evidence of an acute emergency medical condition and the patient appears stable for discharge with appropriate outpatient follow up.    Kathalene Frames, MD 07/25/13 [phone]

## 2013-07-25 NOTE — ED Notes (Signed)
Vomited x4 after eating breakfast this morning. Sts he ate [place] last night and felt bloated afterward.

## 2013-07-25 NOTE — Discharge Instructions (Signed)

## 2014-07-26 ENCOUNTER — Encounter (HOSPITAL_BASED_OUTPATIENT_CLINIC_OR_DEPARTMENT_OTHER): Payer: Self-pay

## 2014-07-26 ENCOUNTER — Emergency Department (HOSPITAL_BASED_OUTPATIENT_CLINIC_OR_DEPARTMENT_OTHER): Payer: Medicare Other

## 2014-07-26 ENCOUNTER — Emergency Department (HOSPITAL_BASED_OUTPATIENT_CLINIC_OR_DEPARTMENT_OTHER)
Admission: EM | Admit: 2014-07-26 | Discharge: 2014-07-26 | Disposition: A | Discharge status: Home / Self Care | Payer: Medicare Other | Attending: Emergency Medicine | Admitting: Emergency Medicine

## 2014-07-26 DIAGNOSIS — J449 Chronic obstructive pulmonary disease, unspecified: Secondary | ICD-10-CM | POA: Diagnosis not present

## 2014-07-26 DIAGNOSIS — Z87891 Personal history of nicotine dependence: Secondary | ICD-10-CM | POA: Diagnosis not present

## 2014-07-26 DIAGNOSIS — L03115 Cellulitis of right lower limb: Secondary | ICD-10-CM

## 2014-07-26 DIAGNOSIS — Z79899 Other long term (current) drug therapy: Secondary | ICD-10-CM | POA: Insufficient documentation

## 2014-07-26 DIAGNOSIS — G8929 Other chronic pain: Secondary | ICD-10-CM | POA: Diagnosis not present

## 2014-07-26 DIAGNOSIS — M25571 Pain in right ankle and joints of right foot: Secondary | ICD-10-CM | POA: Diagnosis present

## 2014-07-26 MED ORDER — KETOROLAC TROMETHAMINE 60 MG/2ML IM SOLN
INTRAMUSCULAR | Status: AC
  Filled 2014-07-26: qty 2

## 2014-07-26 MED ORDER — IBUPROFEN 800 MG PO TABS: 800.0000 mg | ORAL_TABLET | Freq: Once | ORAL | Status: DC

## 2014-07-26 MED ORDER — KETOROLAC TROMETHAMINE 60 MG/2ML IM SOLN
60.0000 mg | Freq: Once | INTRAMUSCULAR | Status: AC
  Administered 2014-07-26: 60 mg via INTRAMUSCULAR
  Filled 2014-07-26: qty 2

## 2014-07-26 MED ORDER — KETOROLAC TROMETHAMINE 60 MG/2ML IM SOLN
60.0000 mg | Freq: Once | INTRAMUSCULAR | Status: AC
  Administered 2014-07-26: 60 mg via INTRAMUSCULAR

## 2014-07-26 MED ORDER — SULFAMETHOXAZOLE-TRIMETHOPRIM 800-160 MG PO TABS: 1.0000 | ORAL_TABLET | Freq: Two times a day (BID) | ORAL | Status: AC

## 2014-07-26 NOTE — ED Notes (Signed)
Patient transported to X-ray 

## 2014-07-26 NOTE — ED Provider Notes (Signed)
CSN: [id number]     Arrival date & time 07/26/14  1202 History   First MD Initiated Contact with Patient 07/26/14 1238     Chief Complaint  Patient presents with  . Foot Pain     (Consider location/radiation/quality/duration/timing/severity/associated sxs/prior Treatment) HPI  Gregory Norris is a 54 y.o. male with PMH of chronic pain, COPD, degenerative disc disease presenting with right foot pain to distal toes but at times radiates up right calf. Worse with ambulation. Patient has been taking ibuprofen with some improvement. Patient denies recent injury. 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 One year ago he was diagnosed with "hammertoe". No laceration, wound. No fevers, chills, nausea, vomiting. No numbness, to healing, weakness. No swelling.   Past Medical History  Diagnosis Date  . Chronic pain   . COPD (chronic obstructive pulmonary disease)   . Degenerative disc disease    Past Surgical History  Procedure Laterality Date  . Neck surgery    . Appendectomy    . Knee surgery     History reviewed. No pertinent family history. History  Substance Use Topics  . Smoking status: Former Smoker    Types: Cigarettes  . Smokeless tobacco: Never Used  . Alcohol Use: No    Review of Systems  Musculoskeletal: Positive for myalgias. Negative for joint swelling.  Skin: Negative for pallor and wound.  Neurological: Negative for weakness and numbness.      Allergies  Peanuts  Home Medications   Prior to Admission medications   Medication Sig Start Date End Date Taking? Authorizing Provider  famotidine (PEPCID) 20 MG tablet Take 1 tablet (20 mg total) by mouth 2 (two) times daily. 06/13/13  Yes April Palumbo, MD  ibuprofen (ADVIL,MOTRIN) 800 MG tablet Take 400 mg by mouth 2 (two) times daily.   Yes Historical Provider, MD  omeprazole (PRILOSEC) 20 MG capsule Take 20 mg by mouth daily.   Yes Historical Provider, MD  oxycodone (ROXICODONE) 30 MG immediate  release tablet Take 30 mg by mouth 2 (two) times daily.   Yes Historical Provider, MD  tamsulosin (FLOMAX) 0.4 MG CAPS capsule Take 0.4 mg by mouth.   Yes Historical Provider, MD  tiotropium (SPIRIVA) 18 MCG inhalation capsule Place 18 mcg into inhaler and inhale daily.   Yes Historical Provider, MD  traZODone (DESYREL) 50 MG tablet Take 50 mg by mouth at bedtime.   Yes Historical Provider, MD  loratadine-pseudoephedrine (CLARITIN-D 24 HOUR) 10-240 MG per 24 hr tablet Take 1 tablet by mouth daily. 06/12/13   Calvert Cantor, MD  ondansetron (ZOFRAN) 4 MG tablet Take 1 tablet (4 mg total) by mouth every 6 (six) hours. 07/25/13   Dorie Rank, MD  sulfamethoxazole-trimethoprim (BACTRIM DS,SEPTRA DS) 800-160 MG per tablet Take 1 tablet by mouth 2 (two) times daily. 07/26/14 08/02/14  Al Corpus, PA-C   BP 132/81 mmHg  Pulse 83  Temp(Src) 98.2 F (36.8 C) (Oral)  Resp 18  Ht 6\' 3"  (1.905 m)  Wt 220 lb (99.791 kg)  BMI 27.50 kg/m2  SpO2 95% Physical Exam  Constitutional: He appears well-developed and well-nourished. No distress.  HENT:  Head: Normocephalic and atraumatic.  Eyes: Conjunctivae are normal. Right eye exhibits no discharge. Left eye exhibits no discharge.  Cardiovascular:  2+ DP and PT pulses equal bilaterally  Pulmonary/Chest: Effort normal. No respiratory distress.  Musculoskeletal:  Tenderness to right medial foot worse to distal big toe and palmar aspect. No swelling. Full range of motion. Patient with erythema  originating second toe and radiates to right ankle representing cellulitis versus lymphangitis  Neurological: He is alert. Coordination normal.  Strength and sensation intact  Skin: He is not diaphoretic.  Psychiatric: He has a normal mood and affect. His behavior is normal.  Nursing note and vitals reviewed.   ED Course  Procedures (including critical care time) Labs Review Labs Reviewed - No data to display  Imaging Review Dg Foot Complete Right  07/26/2014    CLINICAL DATA:  First metatarsal pain and redness for 4 days  EXAM: RIGHT FOOT COMPLETE - 3+ VIEW  COMPARISON:  None.  FINDINGS: Three views of the right foot submitted. No acute fracture or subluxation. Mild hallux valgus deformity. Mild degenerative changes with narrowing of joint space first metatarsal phalangeal joint.  IMPRESSION: No acute fracture or subluxation. Mild hallux valgus deformity. Mild degenerative changes first metatarsal phalangeal joint.   Electronically Signed   By: Lahoma Crocker M.D.   On: 07/26/2014 13:22     EKG Interpretation None      MDM   Final diagnoses:  Cellulitis of right foot   Patient presenting with right foot pain without known injury. Neurovascularly intact. Patient with evidence of cellulitis versus lymphangitis. No systemic symptoms. No fevers. X-ray without evidence of acute fracture or subluxation. Full range of motion without significant pain. No joint swelling. I doubt septic arthritis. Patient given prescription for Bactrim and is to follow-up for recheck in 2 days here/pcp/urgent.  Discussed return precautions with patient. Discussed all results and patient verbalizes understanding and agrees with plan.  Case has been discussed with Dr. Leonides Schanz who agrees with the above plan and to discharge.      Al Corpus, PA-C 07/26/14 [place], DO 07/29/14 [phone]

## 2014-07-26 NOTE — Discharge Instructions (Signed)
Return to the emergency room with worsening of symptoms, new symptoms or with symptoms that are concerning, especially fevers, worsening redness, swelling, nausea, vomiting, generalized ill feeling. Please take all of your antibiotics until finished!   You may develop abdominal discomfort or diarrhea from the antibiotic.  You may help offset this with probiotics which you can buy or get in yogurt. Do not eat  or take the probiotics until 2 hours after your antibiotic.  Follow up with your PCP/[facility] in [place] or [facility] in 2-3 days. Read below information and follow recommendations. Cellulitis Cellulitis is an infection of the skin and the tissue beneath it. The infected area is usually red and tender. Cellulitis occurs most often in the arms and lower legs.  CAUSES  Cellulitis is caused by bacteria that enter the skin through cracks or cuts in the skin. The most common types of bacteria that cause cellulitis are staphylococci and streptococci. SIGNS AND SYMPTOMS   Redness and warmth.  Swelling.  Tenderness or pain.  Fever. DIAGNOSIS  Your health care provider can usually determine what is wrong based on a physical exam. Blood tests may also be done. TREATMENT  Treatment usually involves taking an antibiotic medicine. HOME CARE INSTRUCTIONS   Take your antibiotic medicine as directed by your health care provider. Finish the antibiotic even if you start to feel better.  Keep the infected arm or leg elevated to reduce swelling.  Apply a warm cloth to the affected area up to 4 times per day to relieve pain.  Take medicines only as directed by your health care provider.  Keep all follow-up visits as directed by your health care provider. SEEK MEDICAL CARE IF:   You notice red streaks coming from the infected area.  Your red area gets larger or turns dark in color.  Your bone or joint underneath the infected area becomes painful after the skin  has healed.  Your infection returns in the same area or another area.  You notice a swollen bump in the infected area.  You develop new symptoms.  You have a fever. SEEK IMMEDIATE MEDICAL CARE IF:   You feel very sleepy.  You develop vomiting or diarrhea.  You have a general ill feeling (malaise) with muscle aches and pains. MAKE SURE YOU:   Understand these instructions.  Will watch your condition.  Will get help right away if you are not doing well or get worse. Document Released: 12/16/2004 Document Revised: 07/23/2013 Document Reviewed: 05/24/2011 [facility] Patient Information 2015 [place], [place]. This information is not intended to replace advice given to you by your health care provider. Make sure you discuss any questions you have with your health care provider.

## 2014-07-26 NOTE — ED Notes (Signed)
Pt with right foot pain (worse near toes) radiating up to right calf on ambulation - denies known injury - reports an xray one year ago revealed "hammer toe" - pt reports pain worsened on Monday of this week.

## 2015-09-20 IMAGING — CR DG FOOT COMPLETE 3+V*R*
3 series · 3 of 3 positions shown · non-contrast
Comparison: None.

CLINICAL DATA: First metatarsal pain and redness for 4 days

EXAM:
RIGHT FOOT COMPLETE - 3+ VIEW

[t foot ap right]
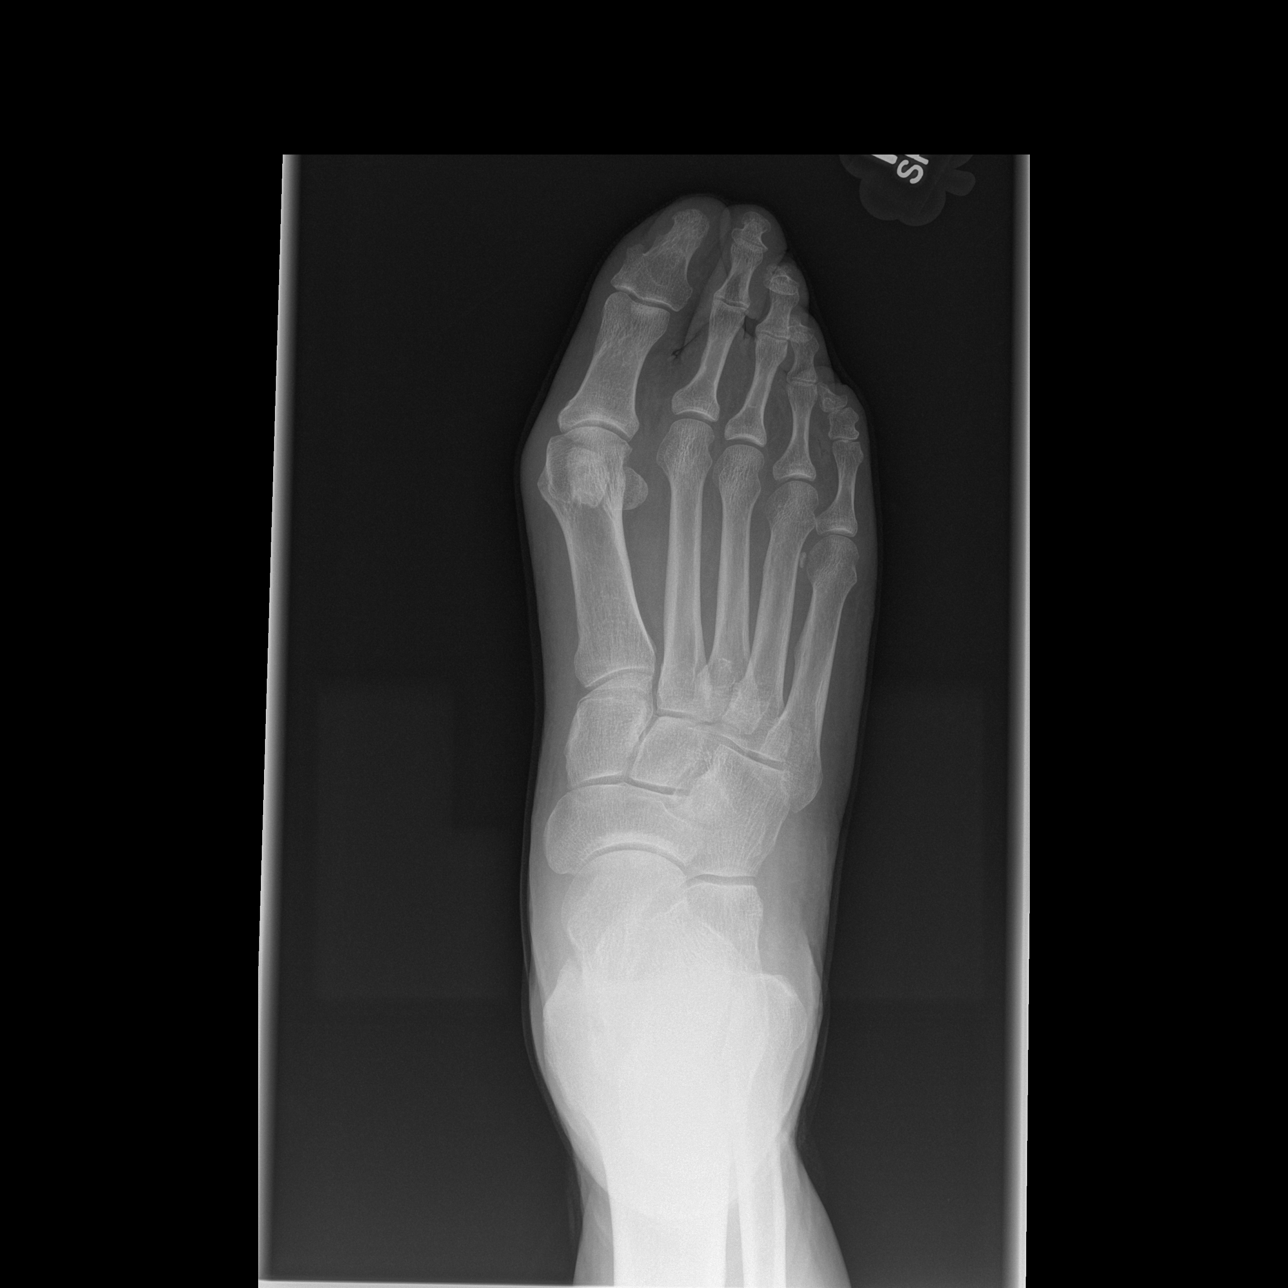

[t foot oblique right]
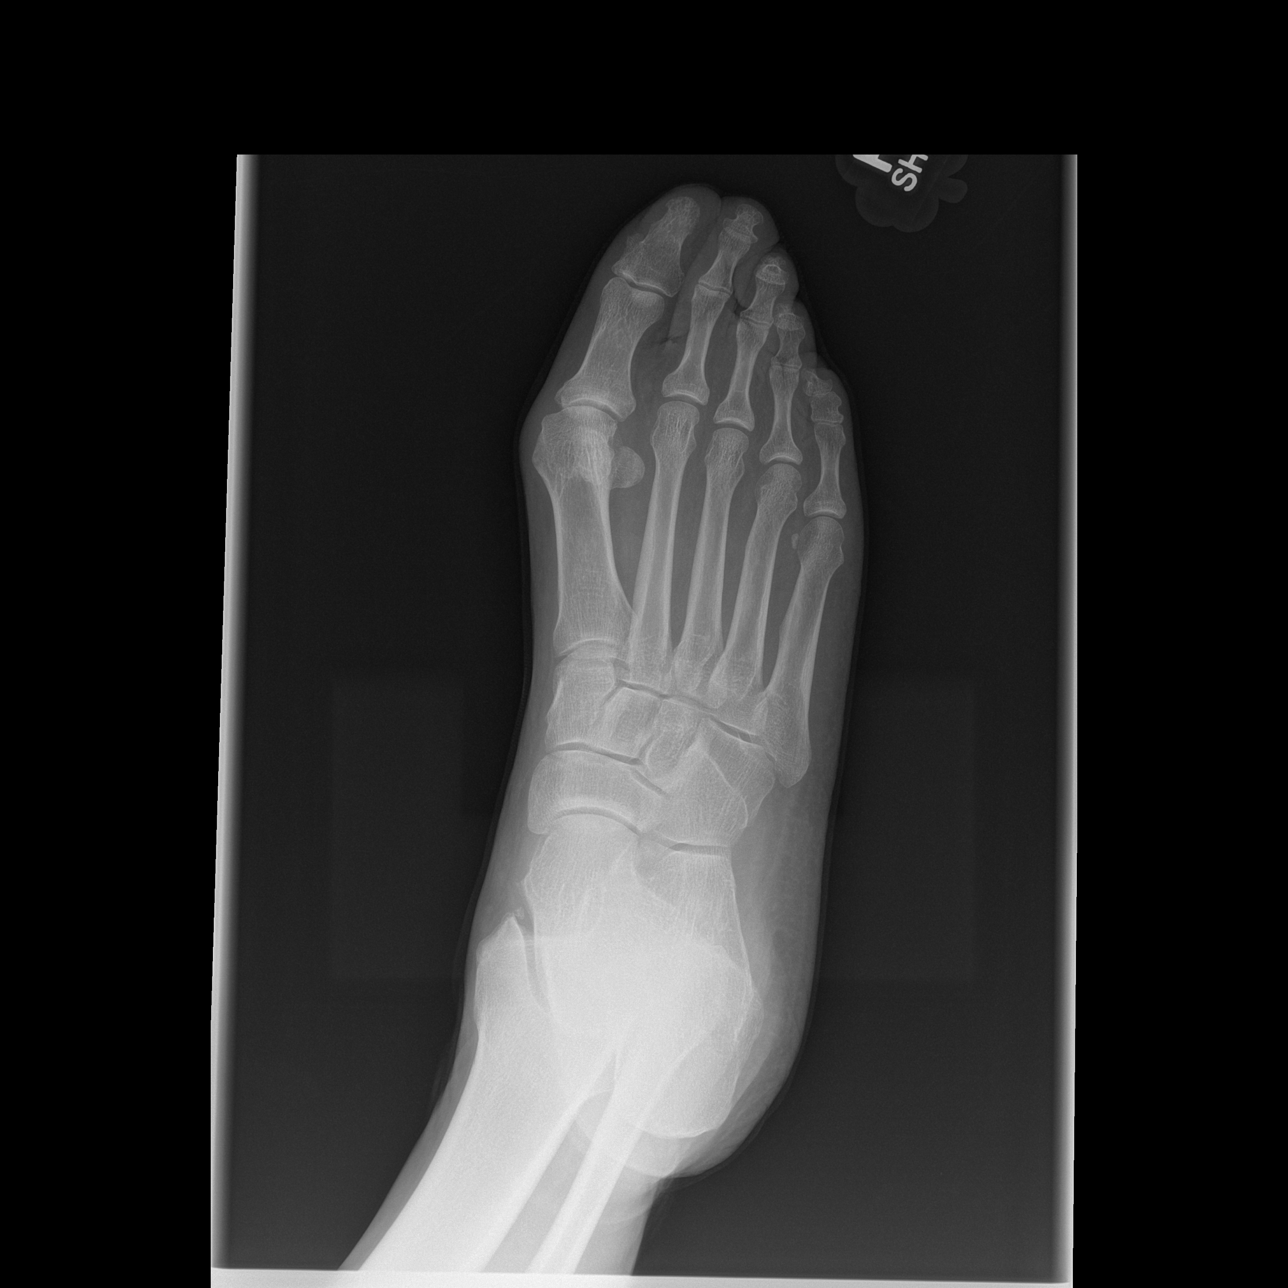

[t foot lat right]
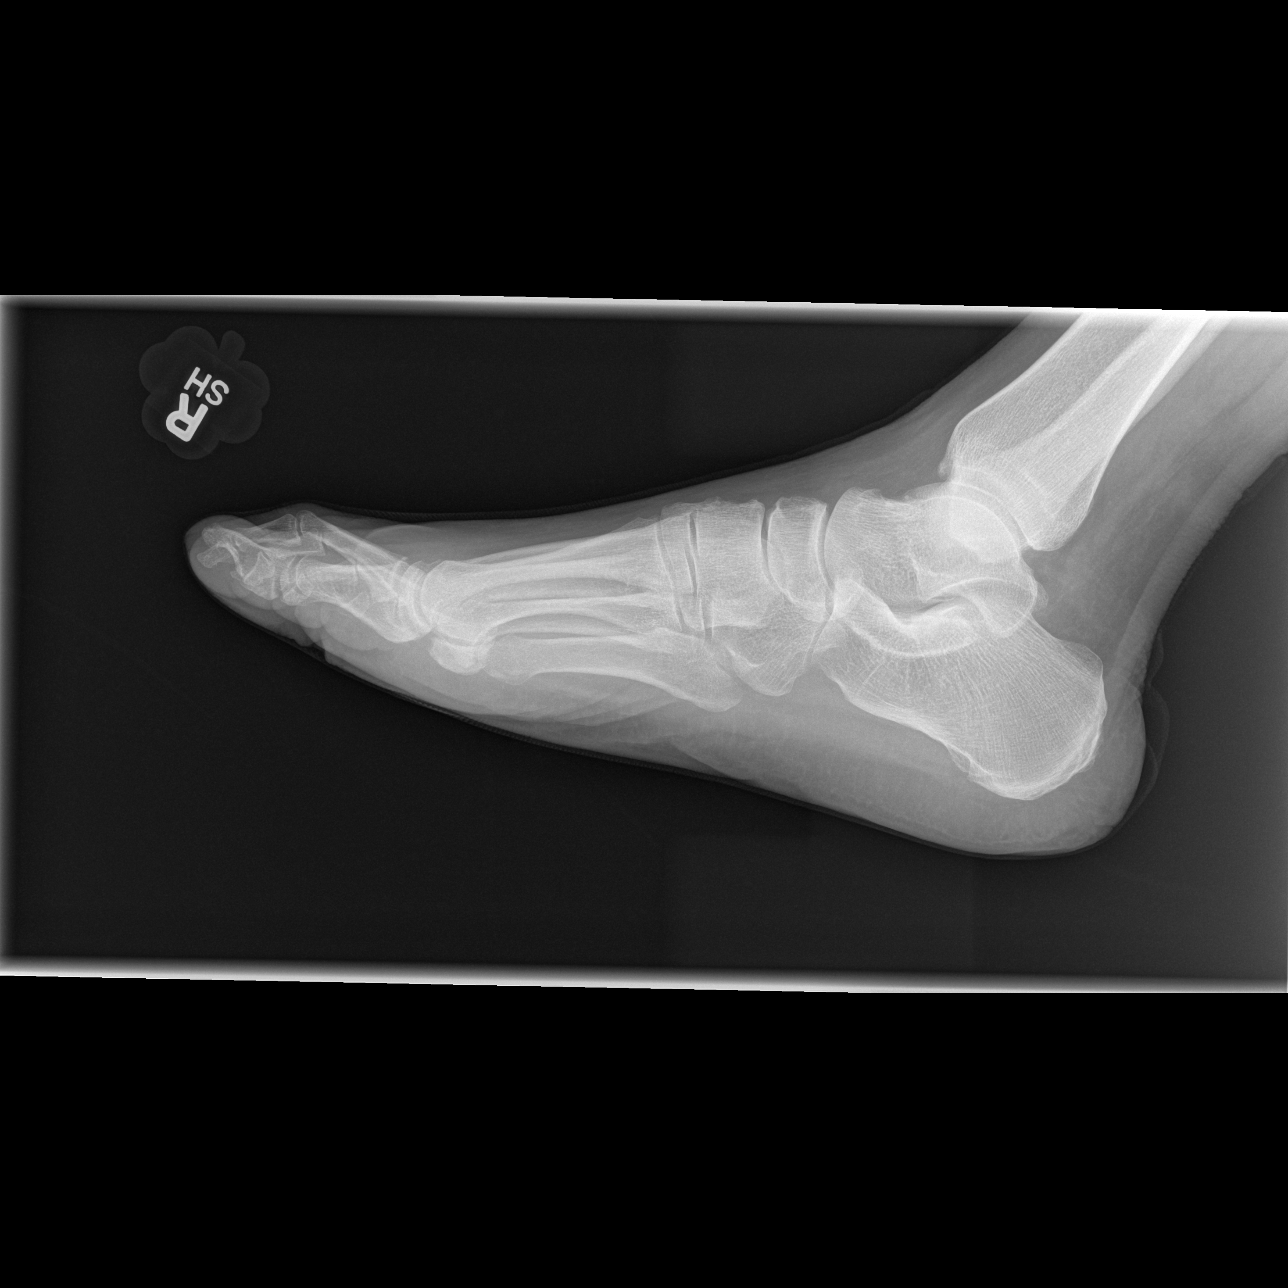

[3 of 3 positions shown; findings below may reference images not displayed]

FINDINGS: Three views of the right foot submitted. No acute fracture or
subluxation. Mild hallux valgus deformity. Mild degenerative changes
with narrowing of joint space first metatarsal phalangeal joint.
IMPRESSION: No acute fracture or subluxation. Mild hallux valgus deformity. Mild
degenerative changes first metatarsal phalangeal joint.

## 2019-06-17 ENCOUNTER — Emergency Department (HOSPITAL_BASED_OUTPATIENT_CLINIC_OR_DEPARTMENT_OTHER)
Admission: EM | Admit: 2019-06-17 | Discharge: 2019-06-17 | Disposition: A | Discharge status: Home / Self Care | Payer: Medicare PPO | Attending: Emergency Medicine | Admitting: Emergency Medicine

## 2019-06-17 ENCOUNTER — Other Ambulatory Visit: Payer: Self-pay

## 2019-06-17 ENCOUNTER — Encounter (HOSPITAL_BASED_OUTPATIENT_CLINIC_OR_DEPARTMENT_OTHER): Payer: Self-pay | Admitting: Emergency Medicine

## 2019-06-17 ENCOUNTER — Emergency Department (HOSPITAL_BASED_OUTPATIENT_CLINIC_OR_DEPARTMENT_OTHER): Payer: Medicare PPO

## 2019-06-17 DIAGNOSIS — Z91010 Allergy to peanuts: Secondary | ICD-10-CM | POA: Insufficient documentation

## 2019-06-17 DIAGNOSIS — Z79899 Other long term (current) drug therapy: Secondary | ICD-10-CM | POA: Insufficient documentation

## 2019-06-17 DIAGNOSIS — M545 Low back pain, unspecified: Secondary | ICD-10-CM

## 2019-06-17 DIAGNOSIS — M5441 Lumbago with sciatica, right side: Secondary | ICD-10-CM | POA: Diagnosis not present

## 2019-06-17 DIAGNOSIS — M25551 Pain in right hip: Secondary | ICD-10-CM | POA: Diagnosis present

## 2019-06-17 DIAGNOSIS — J449 Chronic obstructive pulmonary disease, unspecified: Secondary | ICD-10-CM | POA: Insufficient documentation

## 2019-06-17 DIAGNOSIS — F17210 Nicotine dependence, cigarettes, uncomplicated: Secondary | ICD-10-CM | POA: Diagnosis not present

## 2019-06-17 MED ORDER — KETOROLAC TROMETHAMINE 60 MG/2ML IM SOLN
60.0000 mg | Freq: Once | INTRAMUSCULAR | Status: AC
  Administered 2019-06-17: 14:00:00 60 mg via INTRAMUSCULAR
  Filled 2019-06-17: qty 2

## 2019-06-17 MED ORDER — HYDROCODONE-ACETAMINOPHEN 5-325 MG PO TABS
1.0000 | ORAL_TABLET | Freq: Once | ORAL | Status: AC
  Administered 2019-06-17: 1 via ORAL
  Filled 2019-06-17: qty 1

## 2019-06-17 MED ORDER — CYCLOBENZAPRINE HCL 10 MG PO TABS: 10.0000 mg | ORAL_TABLET | Freq: Two times a day (BID) | ORAL | 0 refills | Status: AC | PRN

## 2019-06-17 MED ORDER — OXYCODONE-ACETAMINOPHEN 5-325 MG PO TABS: 1.0000 | ORAL_TABLET | Freq: Four times a day (QID) | ORAL | 0 refills | Status: AC | PRN

## 2019-06-17 MED ORDER — CYCLOBENZAPRINE HCL 10 MG PO TABS
10.0000 mg | ORAL_TABLET | Freq: Once | ORAL | Status: AC
  Administered 2019-06-17: 10 mg via ORAL
  Filled 2019-06-17: qty 1

## 2019-06-17 NOTE — ED Provider Notes (Signed)
[facility] Provider Note   CSN: [id number] Arrival date & time: 06/17/19  1205     History Chief Complaint  Patient presents with  . Hip Pain    [facility] is a 59 y.o. male.  Patient is a 59 year old gentleman with past medical history of COPD and degenerative disc disease presenting to the emergency department for back and right hip pain gradually worsening over the last couple of days.  Patient reports a history of a "bad back".  Denies any injury or trauma but does report that he moved to a new place this month and has been sleeping on a hard floor.  The pain is worse with movement and relieved with rest.  He has no numbness, tingling, weakness, saddle anesthesia, loss of control of bowel or bladder movements.  He has not tried anything for relief.        Past Medical History:  Diagnosis Date  . Chronic pain   . COPD (chronic obstructive pulmonary disease) ([place])   . Degenerative disc disease     There are no problems to display for this patient.   Past Surgical History:  Procedure Laterality Date  . APPENDECTOMY    . KNEE SURGERY    . NECK SURGERY         No family history on file.  Social History   Tobacco Use  . Smoking status: Current Every Day Smoker    Types: Cigarettes  . Smokeless tobacco: Never Used  Substance Use Topics  . Alcohol use: Yes  . Drug use: No    Home Medications Prior to Admission medications   Medication Sig Start Date End Date Taking? Authorizing Provider  cyclobenzaprine (FLEXERIL) 10 MG tablet Take 1 tablet (10 mg total) by mouth 2 (two) times daily as needed for up to 7 days for muscle spasms. 06/17/19 06/24/19  Alveria Apley, PA-C  famotidine (PEPCID) 20 MG tablet Take 1 tablet (20 mg total) by mouth 2 (two) times daily. 06/13/13   Palumbo, April, MD  ibuprofen (ADVIL,MOTRIN) 800 MG tablet Take 400 mg by mouth 2 (two) times daily.    [provider]  loratadine-pseudoephedrine  (CLARITIN-D 24 HOUR) 10-240 MG per 24 hr tablet Take 1 tablet by mouth daily. 06/12/13   Truddie Hidden, MD  omeprazole (PRILOSEC) 20 MG capsule Take 20 mg by mouth daily.    [provider]  ondansetron (ZOFRAN) 4 MG tablet Take 1 tablet (4 mg total) by mouth every 6 (six) hours. 07/25/13   Dorie Rank, MD  oxycodone (ROXICODONE) 30 MG immediate release tablet Take 30 mg by mouth 2 (two) times daily.    [provider]  oxyCODONE-acetaminophen (PERCOCET/ROXICET) 5-325 MG tablet Take 1 tablet by mouth every 6 (six) hours as needed for up to 2 days for severe pain. 06/17/19 06/19/19  Alveria Apley, PA-C  tamsulosin (FLOMAX) 0.4 MG CAPS capsule Take 0.4 mg by mouth.    [provider]  tiotropium (SPIRIVA) 18 MCG inhalation capsule Place 18 mcg into inhaler and inhale daily.    [provider]  traZODone (DESYREL) 50 MG tablet Take 50 mg by mouth at bedtime.    [provider]  loratadine (CLARITIN) 10 MG tablet Take 1 tablet (10 mg total) by mouth daily. 04/18/13 06/12/13  Ignacia Felling, PA-C    Allergies    Peanuts [peanut oil]  Review of Systems   Review of Systems  Constitutional: Negative for chills and fever.  HENT:  Negative for ear pain and sore throat.   Eyes: Negative for pain and visual disturbance.  Respiratory: Negative for cough and shortness of breath.   Cardiovascular: Negative for chest pain and palpitations.  Gastrointestinal: Negative for abdominal pain and vomiting.  Genitourinary: Negative for dysuria and hematuria.  Musculoskeletal: Positive for arthralgias and back pain. Negative for gait problem and neck pain.  Skin: Negative for color change and rash.  Neurological: Negative for seizures and syncope.  All other systems reviewed and are negative.   Physical Exam Updated Vital Signs BP 135/69 (BP Location: Left Arm)   Pulse 88   Temp 98.2 F (36.8 C) (Oral)   Resp 16   Ht 6\' 2"  (1.88 m)   Wt 103.9 kg   SpO2 97%    BMI 29.40 kg/m   Physical Exam Vitals and nursing note reviewed.  Constitutional:      General: He is not in acute distress.    Appearance: Normal appearance. He is not ill-appearing, toxic-appearing or diaphoretic.  HENT:     Head: Normocephalic.  Eyes:     Conjunctiva/sclera: Conjunctivae normal.  Cardiovascular:     Rate and Rhythm: Normal rate and regular rhythm.  Pulmonary:     Effort: Pulmonary effort is normal.  Musculoskeletal:     Comments: Diffusely tender with palpable trigger points in the right lower lumbar paraspinal muscles, buttock and lateral right hip.  Skin:    General: Skin is warm and dry.     Findings: No bruising or erythema.  Neurological:     General: No focal deficit present.     Mental Status: He is alert.     Sensory: No sensory deficit.     Motor: No weakness.     Gait: Gait normal.     Deep Tendon Reflexes: Reflexes normal.  Psychiatric:        Mood and Affect: Mood normal.     ED Results / Procedures / Treatments   Labs (all labs ordered are listed, but only abnormal results are displayed) Labs Reviewed - No data to display  EKG None  Radiology DG Hip Unilat With Pelvis 2-3 Views Right  Result Date: 06/17/2019 CLINICAL DATA:  Right hip pain.  No known injury. EXAM: DG HIP (WITH OR WITHOUT PELVIS) 2-3V RIGHT COMPARISON:  None. FINDINGS: There is no evidence of hip fracture or dislocation. There is no evidence of arthropathy or other focal bone abnormality. IMPRESSION: Negative. Electronically Signed   By: Lorriane Shire M.D.   On: 06/17/2019 14:07    Procedures Procedures (including critical care time)  Medications Ordered in ED Medications  ketorolac (TORADOL) injection 60 mg (60 mg Intramuscular Given 06/17/19 1417)  HYDROcodone-acetaminophen (NORCO/VICODIN) 5-325 MG per tablet 1 tablet (1 tablet Oral Given 06/17/19 1424)  cyclobenzaprine (FLEXERIL) tablet 10 mg (10 mg Oral Given 06/17/19 1424)    ED Course  I have reviewed the  triage vital signs and the nursing notes.  Pertinent labs & imaging results that were available during my care of the patient were reviewed by me and considered in my medical decision making (see chart for details).  Clinical Course as of Jun 17 1546  Sun Jun 17, 2019  1414 Patient with musculoskeletal hip and back pain.  History of the same in the past.  X-rays reassuring.  No red flag symptoms or concern for cord compression or infectious process.  Patient was improved with Norco, Flexeril, Toradol.  Patient recently moved here and will be referred to primary  care doctor for further work-up and treatment.  He requested pain management referral given that he has chronic back pain.  This will be deferred to primary care.  He reports he used to see the [facility] for chronic pain management but stopped going when they no longer gave him an Rx for oxycodone. Patient advised on conservative treatment and return precautions.   [KM]    Clinical Course User Index [KM] Kristine Royal   MDM Rules/Calculators/A&P                      Based on review of vitals, medical screening exam, lab work and/or imaging, there does not appear to be an acute, emergent etiology for the patient's symptoms. Counseled pt on good return precautions and encouraged both PCP and ED follow-up as needed.  Prior to discharge, I also discussed incidental imaging findings with patient in detail and advised appropriate, recommended follow-up in detail.  Clinical Impression: 1. Acute right-sided low back pain without sciatica     Disposition: Discharge  Prior to providing a prescription for a controlled substance, I independently reviewed the patient's recent prescription history on the [facility]. The patient had no recent or regular prescriptions and was deemed appropriate for a brief, less than 3 day prescription of narcotic for acute analgesia.  This note was prepared with  assistance of Systems analyst. Occasional wrong-word or sound-a-like substitutions may have occurred due to the inherent limitations of voice recognition software.  Final Clinical Impression(s) / ED Diagnoses Final diagnoses:  Acute right-sided low back pain without sciatica    Rx / DC Orders ED Discharge Orders         Ordered    oxyCODONE-acetaminophen (PERCOCET/ROXICET) 5-325 MG tablet  Every 6 hours PRN     06/17/19 1547    cyclobenzaprine (FLEXERIL) 10 MG tablet  2 times daily PRN     06/17/19 1547           Kristine Royal 06/17/19 1549    Little, Wenda Overland, MD 06/19/19 1456

## 2019-06-17 NOTE — ED Notes (Signed)
D/C instructions reviewed.  Verbalized understanding.

## 2019-06-17 NOTE — Discharge Instructions (Addendum)
Thank you for allowing me to care for you today. Please return to the emergency department if you have new or worsening symptoms. Take your medications as instructed.  ° °

## 2019-06-17 NOTE — ED Triage Notes (Signed)
Pt brought in by EMS for R hip pain. Denies injury. States he has been sleeping on a hard floor and has a hx of back issues.
